# Patient Record
Sex: Male | Born: 1945
Health system: Southern US, Community
[De-identification: ages and names within clinical notes are randomized; demographics above are authoritative.]

## PROBLEM LIST (undated history)

## (undated) DIAGNOSIS — I10 Essential (primary) hypertension: Secondary | ICD-10-CM

## (undated) DIAGNOSIS — M5126 Other intervertebral disc displacement, lumbar region: Secondary | ICD-10-CM

## (undated) DIAGNOSIS — N189 Chronic kidney disease, unspecified: Secondary | ICD-10-CM

## (undated) DIAGNOSIS — E785 Hyperlipidemia, unspecified: Secondary | ICD-10-CM

## (undated) DIAGNOSIS — M51369 Other intervertebral disc degeneration, lumbar region without mention of lumbar back pain or lower extremity pain: Secondary | ICD-10-CM

## (undated) HISTORY — PX: NECK SURGERY: SHX720

## (undated) HISTORY — DX: Hyperlipidemia, unspecified: E78.5

## (undated) HISTORY — DX: Essential (primary) hypertension: I10

## (undated) HISTORY — PX: CHOLECYSTECTOMY: SHX55

## (undated) HISTORY — PX: BACK SURGERY: SHX140

## (undated) HISTORY — DX: Other intervertebral disc displacement, lumbar region: M51.26

## (undated) HISTORY — DX: Chronic kidney disease, unspecified: N18.9

## (undated) HISTORY — DX: Other intervertebral disc degeneration, lumbar region without mention of lumbar back pain or lower extremity pain: M51.369

---

## 1998-11-10 ENCOUNTER — Inpatient Hospital Stay (HOSPITAL_COMMUNITY): Admission: EM | Admit: 1998-11-10 | Discharge: 1998-11-11 | Payer: Self-pay | Admitting: Emergency Medicine

## 1998-11-10 ENCOUNTER — Encounter: Payer: Self-pay | Admitting: Neurosurgery

## 1999-01-21 ENCOUNTER — Encounter: Payer: Self-pay | Admitting: Neurosurgery

## 1999-01-21 ENCOUNTER — Ambulatory Visit (HOSPITAL_COMMUNITY): Admission: RE | Admit: 1999-01-21 | Discharge: 1999-01-21 | Payer: Self-pay | Admitting: Neurosurgery

## 2001-04-08 ENCOUNTER — Emergency Department (HOSPITAL_COMMUNITY): Admission: EM | Admit: 2001-04-08 | Discharge: 2001-04-08 | Payer: Self-pay | Admitting: Emergency Medicine

## 2001-04-08 ENCOUNTER — Encounter: Payer: Self-pay | Admitting: Emergency Medicine

## 2002-06-14 ENCOUNTER — Ambulatory Visit (HOSPITAL_COMMUNITY): Admission: RE | Admit: 2002-06-14 | Discharge: 2002-06-14 | Payer: Self-pay | Admitting: Orthopaedic Surgery

## 2002-06-14 ENCOUNTER — Encounter: Payer: Self-pay | Admitting: Orthopaedic Surgery

## 2002-08-08 ENCOUNTER — Emergency Department (HOSPITAL_COMMUNITY): Admission: EM | Admit: 2002-08-08 | Discharge: 2002-08-08 | Payer: Self-pay | Admitting: Emergency Medicine

## 2002-08-08 ENCOUNTER — Encounter: Payer: Self-pay | Admitting: Emergency Medicine

## 2004-04-15 ENCOUNTER — Emergency Department (HOSPITAL_COMMUNITY): Admission: EM | Admit: 2004-04-15 | Discharge: 2004-04-15 | Payer: Self-pay | Admitting: Emergency Medicine

## 2004-04-19 ENCOUNTER — Emergency Department (HOSPITAL_COMMUNITY): Admission: EM | Admit: 2004-04-19 | Discharge: 2004-04-20 | Payer: Self-pay | Admitting: *Deleted

## 2004-07-30 ENCOUNTER — Ambulatory Visit (HOSPITAL_COMMUNITY): Admission: RE | Admit: 2004-07-30 | Discharge: 2004-07-30 | Payer: Self-pay | Admitting: Allergy

## 2005-08-18 ENCOUNTER — Emergency Department (HOSPITAL_COMMUNITY): Admission: EM | Admit: 2005-08-18 | Discharge: 2005-08-18 | Payer: Self-pay | Admitting: Emergency Medicine

## 2008-02-06 ENCOUNTER — Ambulatory Visit (HOSPITAL_COMMUNITY): Admission: RE | Admit: 2008-02-06 | Discharge: 2008-02-06 | Payer: Self-pay | Admitting: Preventative Medicine

## 2008-12-01 ENCOUNTER — Emergency Department (HOSPITAL_COMMUNITY): Admission: EM | Admit: 2008-12-01 | Discharge: 2008-12-01 | Payer: Self-pay | Admitting: Emergency Medicine

## 2009-07-23 ENCOUNTER — Ambulatory Visit (HOSPITAL_COMMUNITY): Admission: RE | Admit: 2009-07-23 | Discharge: 2009-07-23 | Payer: Self-pay | Admitting: Orthopaedic Surgery

## 2011-08-21 IMAGING — CR DG CHEST 2V
2 series · 2 of 2 positions shown · non-contrast
Comparison: None.

CLINICAL DATA: Cough.
 CHEST TWO VIEWS:

[view not recorded (1 of 2)]
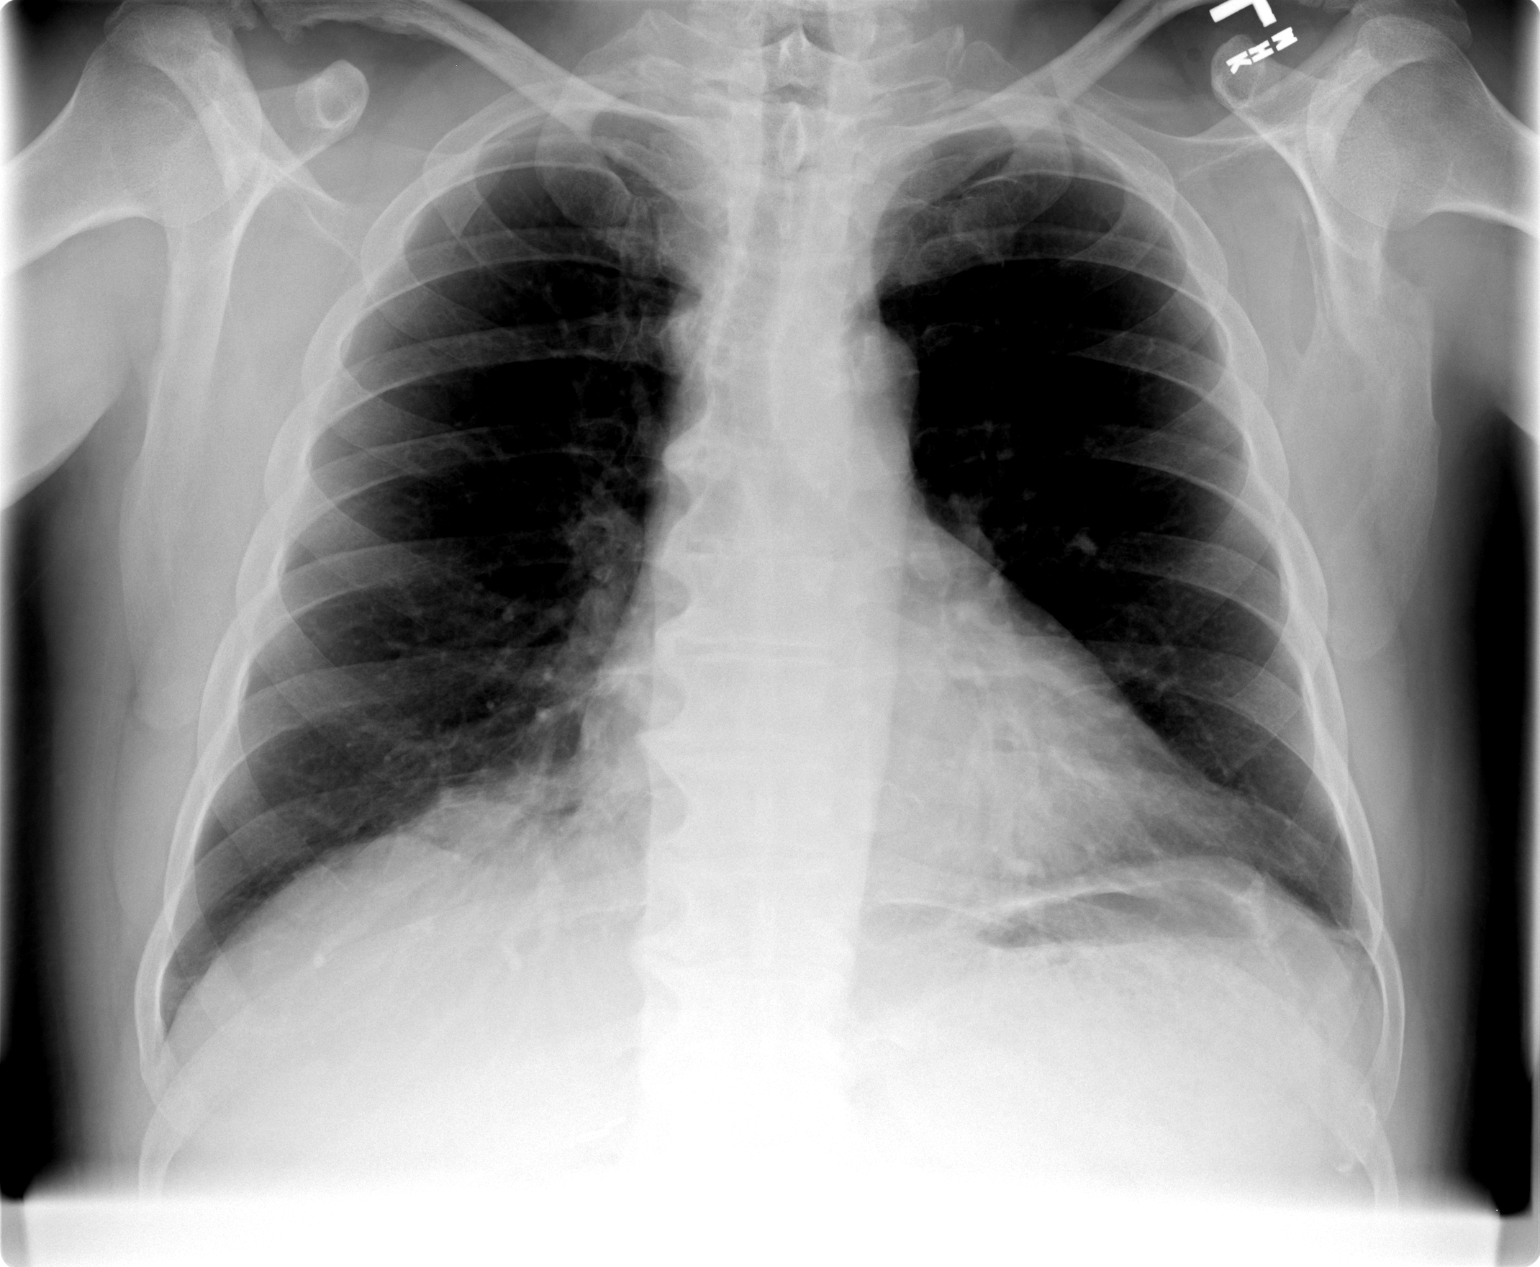

[view not recorded (2 of 2)]
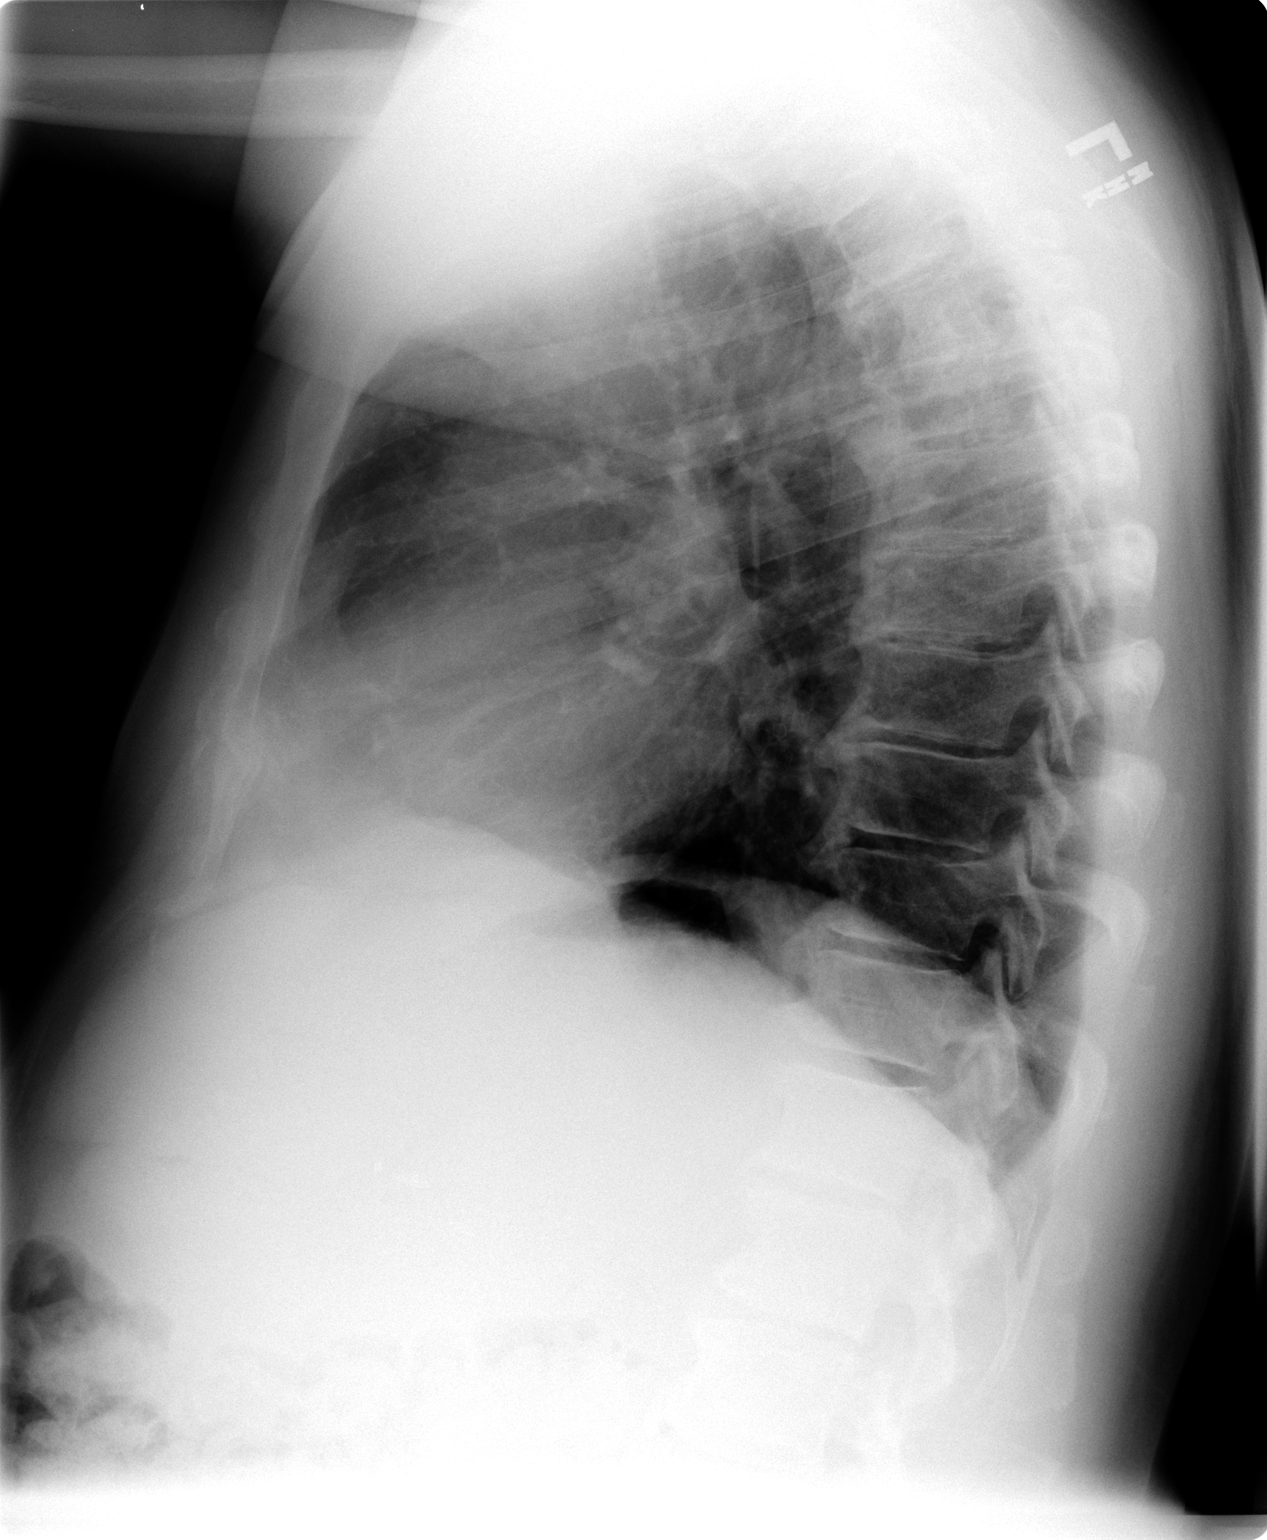

[2 of 2 positions shown; findings below may reference images not displayed]

Heart size upper limits of normal with cardiothoracic ratio approaching 50%.  Lungs clear.  Osseous structures intact with prominent osteophytes of the spine.
IMPRESSION: No active cardiopulmonary disease.

## 2012-03-28 DIAGNOSIS — Z23 Encounter for immunization: Secondary | ICD-10-CM | POA: Diagnosis not present

## 2013-04-02 DIAGNOSIS — Z23 Encounter for immunization: Secondary | ICD-10-CM | POA: Diagnosis not present

## 2013-07-30 DIAGNOSIS — Z0000 Encounter for general adult medical examination without abnormal findings: Secondary | ICD-10-CM | POA: Diagnosis not present

## 2013-07-30 DIAGNOSIS — I10 Essential (primary) hypertension: Secondary | ICD-10-CM | POA: Diagnosis not present

## 2013-08-20 DIAGNOSIS — R7309 Other abnormal glucose: Secondary | ICD-10-CM | POA: Diagnosis not present

## 2013-08-20 DIAGNOSIS — I10 Essential (primary) hypertension: Secondary | ICD-10-CM | POA: Diagnosis not present

## 2014-01-20 DIAGNOSIS — I10 Essential (primary) hypertension: Secondary | ICD-10-CM | POA: Diagnosis not present

## 2014-01-23 DIAGNOSIS — R5381 Other malaise: Secondary | ICD-10-CM | POA: Diagnosis not present

## 2014-01-23 DIAGNOSIS — R5383 Other fatigue: Secondary | ICD-10-CM | POA: Diagnosis not present

## 2014-01-23 DIAGNOSIS — N529 Male erectile dysfunction, unspecified: Secondary | ICD-10-CM | POA: Diagnosis not present

## 2014-01-23 DIAGNOSIS — I10 Essential (primary) hypertension: Secondary | ICD-10-CM | POA: Diagnosis not present

## 2014-03-14 DIAGNOSIS — Z23 Encounter for immunization: Secondary | ICD-10-CM | POA: Diagnosis not present

## 2014-05-05 DIAGNOSIS — I10 Essential (primary) hypertension: Secondary | ICD-10-CM | POA: Diagnosis not present

## 2014-05-05 DIAGNOSIS — J309 Allergic rhinitis, unspecified: Secondary | ICD-10-CM | POA: Diagnosis not present

## 2014-09-01 DIAGNOSIS — I10 Essential (primary) hypertension: Secondary | ICD-10-CM | POA: Diagnosis not present

## 2014-09-03 DIAGNOSIS — E782 Mixed hyperlipidemia: Secondary | ICD-10-CM | POA: Diagnosis not present

## 2014-09-03 DIAGNOSIS — I10 Essential (primary) hypertension: Secondary | ICD-10-CM | POA: Diagnosis not present

## 2014-09-03 DIAGNOSIS — Z6822 Body mass index (BMI) 22.0-22.9, adult: Secondary | ICD-10-CM | POA: Diagnosis not present

## 2015-02-10 DIAGNOSIS — E782 Mixed hyperlipidemia: Secondary | ICD-10-CM | POA: Diagnosis not present

## 2015-02-10 DIAGNOSIS — I10 Essential (primary) hypertension: Secondary | ICD-10-CM | POA: Diagnosis not present

## 2015-02-13 DIAGNOSIS — E782 Mixed hyperlipidemia: Secondary | ICD-10-CM | POA: Diagnosis not present

## 2015-02-13 DIAGNOSIS — J309 Allergic rhinitis, unspecified: Secondary | ICD-10-CM | POA: Diagnosis not present

## 2015-02-13 DIAGNOSIS — R944 Abnormal results of kidney function studies: Secondary | ICD-10-CM | POA: Diagnosis not present

## 2015-02-13 DIAGNOSIS — R070 Pain in throat: Secondary | ICD-10-CM | POA: Diagnosis not present

## 2015-02-13 DIAGNOSIS — I10 Essential (primary) hypertension: Secondary | ICD-10-CM | POA: Diagnosis not present

## 2015-03-25 DIAGNOSIS — Z23 Encounter for immunization: Secondary | ICD-10-CM | POA: Diagnosis not present

## 2015-08-17 DIAGNOSIS — E782 Mixed hyperlipidemia: Secondary | ICD-10-CM | POA: Diagnosis not present

## 2015-08-17 DIAGNOSIS — I10 Essential (primary) hypertension: Secondary | ICD-10-CM | POA: Diagnosis not present

## 2015-08-17 DIAGNOSIS — R7301 Impaired fasting glucose: Secondary | ICD-10-CM | POA: Diagnosis not present

## 2015-08-19 DIAGNOSIS — I10 Essential (primary) hypertension: Secondary | ICD-10-CM | POA: Diagnosis not present

## 2015-08-19 DIAGNOSIS — E782 Mixed hyperlipidemia: Secondary | ICD-10-CM | POA: Diagnosis not present

## 2015-08-19 DIAGNOSIS — R944 Abnormal results of kidney function studies: Secondary | ICD-10-CM | POA: Diagnosis not present

## 2016-02-22 DIAGNOSIS — R7301 Impaired fasting glucose: Secondary | ICD-10-CM | POA: Diagnosis not present

## 2016-02-22 DIAGNOSIS — E782 Mixed hyperlipidemia: Secondary | ICD-10-CM | POA: Diagnosis not present

## 2016-02-24 DIAGNOSIS — E782 Mixed hyperlipidemia: Secondary | ICD-10-CM | POA: Diagnosis not present

## 2016-02-24 DIAGNOSIS — I10 Essential (primary) hypertension: Secondary | ICD-10-CM | POA: Diagnosis not present

## 2016-02-24 DIAGNOSIS — D649 Anemia, unspecified: Secondary | ICD-10-CM | POA: Diagnosis not present

## 2016-03-22 DIAGNOSIS — Z23 Encounter for immunization: Secondary | ICD-10-CM | POA: Diagnosis not present

## 2016-08-26 DIAGNOSIS — Z125 Encounter for screening for malignant neoplasm of prostate: Secondary | ICD-10-CM | POA: Diagnosis not present

## 2016-08-26 DIAGNOSIS — I10 Essential (primary) hypertension: Secondary | ICD-10-CM | POA: Diagnosis not present

## 2016-08-26 DIAGNOSIS — D649 Anemia, unspecified: Secondary | ICD-10-CM | POA: Diagnosis not present

## 2016-08-26 DIAGNOSIS — R7301 Impaired fasting glucose: Secondary | ICD-10-CM | POA: Diagnosis not present

## 2016-08-26 DIAGNOSIS — E782 Mixed hyperlipidemia: Secondary | ICD-10-CM | POA: Diagnosis not present

## 2016-08-31 DIAGNOSIS — Z0000 Encounter for general adult medical examination without abnormal findings: Secondary | ICD-10-CM | POA: Diagnosis not present

## 2016-08-31 DIAGNOSIS — N182 Chronic kidney disease, stage 2 (mild): Secondary | ICD-10-CM | POA: Diagnosis not present

## 2016-08-31 DIAGNOSIS — I10 Essential (primary) hypertension: Secondary | ICD-10-CM | POA: Diagnosis not present

## 2016-08-31 DIAGNOSIS — D649 Anemia, unspecified: Secondary | ICD-10-CM | POA: Diagnosis not present

## 2016-08-31 DIAGNOSIS — E782 Mixed hyperlipidemia: Secondary | ICD-10-CM | POA: Diagnosis not present

## 2017-04-10 DIAGNOSIS — Z23 Encounter for immunization: Secondary | ICD-10-CM | POA: Diagnosis not present

## 2017-08-30 DIAGNOSIS — I10 Essential (primary) hypertension: Secondary | ICD-10-CM | POA: Diagnosis not present

## 2017-08-30 DIAGNOSIS — R7301 Impaired fasting glucose: Secondary | ICD-10-CM | POA: Diagnosis not present

## 2017-08-30 DIAGNOSIS — E782 Mixed hyperlipidemia: Secondary | ICD-10-CM | POA: Diagnosis not present

## 2017-08-30 DIAGNOSIS — D649 Anemia, unspecified: Secondary | ICD-10-CM | POA: Diagnosis not present

## 2017-09-01 DIAGNOSIS — D649 Anemia, unspecified: Secondary | ICD-10-CM | POA: Diagnosis not present

## 2017-09-01 DIAGNOSIS — R7301 Impaired fasting glucose: Secondary | ICD-10-CM | POA: Diagnosis not present

## 2017-09-01 DIAGNOSIS — I10 Essential (primary) hypertension: Secondary | ICD-10-CM | POA: Diagnosis not present

## 2017-09-01 DIAGNOSIS — N182 Chronic kidney disease, stage 2 (mild): Secondary | ICD-10-CM | POA: Diagnosis not present

## 2017-09-01 DIAGNOSIS — E782 Mixed hyperlipidemia: Secondary | ICD-10-CM | POA: Diagnosis not present

## 2017-09-01 DIAGNOSIS — Z6823 Body mass index (BMI) 23.0-23.9, adult: Secondary | ICD-10-CM | POA: Diagnosis not present

## 2018-01-15 DIAGNOSIS — N182 Chronic kidney disease, stage 2 (mild): Secondary | ICD-10-CM | POA: Diagnosis not present

## 2018-01-15 DIAGNOSIS — E782 Mixed hyperlipidemia: Secondary | ICD-10-CM | POA: Diagnosis not present

## 2018-01-15 DIAGNOSIS — Z79899 Other long term (current) drug therapy: Secondary | ICD-10-CM | POA: Diagnosis not present

## 2018-01-15 DIAGNOSIS — R5383 Other fatigue: Secondary | ICD-10-CM | POA: Diagnosis not present

## 2018-01-15 DIAGNOSIS — I10 Essential (primary) hypertension: Secondary | ICD-10-CM | POA: Diagnosis not present

## 2018-01-15 DIAGNOSIS — R7301 Impaired fasting glucose: Secondary | ICD-10-CM | POA: Diagnosis not present

## 2018-01-15 DIAGNOSIS — D649 Anemia, unspecified: Secondary | ICD-10-CM | POA: Diagnosis not present

## 2018-01-18 DIAGNOSIS — R7301 Impaired fasting glucose: Secondary | ICD-10-CM | POA: Diagnosis not present

## 2018-01-18 DIAGNOSIS — N182 Chronic kidney disease, stage 2 (mild): Secondary | ICD-10-CM | POA: Diagnosis not present

## 2018-01-18 DIAGNOSIS — D649 Anemia, unspecified: Secondary | ICD-10-CM | POA: Diagnosis not present

## 2018-01-18 DIAGNOSIS — Z6821 Body mass index (BMI) 21.0-21.9, adult: Secondary | ICD-10-CM | POA: Diagnosis not present

## 2018-01-18 DIAGNOSIS — E782 Mixed hyperlipidemia: Secondary | ICD-10-CM | POA: Diagnosis not present

## 2018-01-18 DIAGNOSIS — I10 Essential (primary) hypertension: Secondary | ICD-10-CM | POA: Diagnosis not present

## 2018-03-14 DIAGNOSIS — Z23 Encounter for immunization: Secondary | ICD-10-CM | POA: Diagnosis not present

## 2018-06-12 DIAGNOSIS — H0230 Blepharochalasis unspecified eye, unspecified eyelid: Secondary | ICD-10-CM | POA: Diagnosis not present

## 2018-06-12 DIAGNOSIS — H52223 Regular astigmatism, bilateral: Secondary | ICD-10-CM | POA: Diagnosis not present

## 2018-06-12 DIAGNOSIS — H5213 Myopia, bilateral: Secondary | ICD-10-CM | POA: Diagnosis not present

## 2018-06-12 DIAGNOSIS — H2513 Age-related nuclear cataract, bilateral: Secondary | ICD-10-CM | POA: Diagnosis not present

## 2018-07-24 DIAGNOSIS — E782 Mixed hyperlipidemia: Secondary | ICD-10-CM | POA: Diagnosis not present

## 2018-07-24 DIAGNOSIS — I10 Essential (primary) hypertension: Secondary | ICD-10-CM | POA: Diagnosis not present

## 2018-07-24 DIAGNOSIS — R7301 Impaired fasting glucose: Secondary | ICD-10-CM | POA: Diagnosis not present

## 2018-07-24 DIAGNOSIS — D649 Anemia, unspecified: Secondary | ICD-10-CM | POA: Diagnosis not present

## 2018-07-31 DIAGNOSIS — I10 Essential (primary) hypertension: Secondary | ICD-10-CM | POA: Diagnosis not present

## 2018-07-31 DIAGNOSIS — E782 Mixed hyperlipidemia: Secondary | ICD-10-CM | POA: Diagnosis not present

## 2018-07-31 DIAGNOSIS — N182 Chronic kidney disease, stage 2 (mild): Secondary | ICD-10-CM | POA: Diagnosis not present

## 2018-07-31 DIAGNOSIS — R7301 Impaired fasting glucose: Secondary | ICD-10-CM | POA: Diagnosis not present

## 2018-07-31 DIAGNOSIS — K4090 Unilateral inguinal hernia, without obstruction or gangrene, not specified as recurrent: Secondary | ICD-10-CM | POA: Diagnosis not present

## 2018-07-31 DIAGNOSIS — D649 Anemia, unspecified: Secondary | ICD-10-CM | POA: Diagnosis not present

## 2018-07-31 DIAGNOSIS — D696 Thrombocytopenia, unspecified: Secondary | ICD-10-CM | POA: Diagnosis not present

## 2018-11-22 DIAGNOSIS — Z0000 Encounter for general adult medical examination without abnormal findings: Secondary | ICD-10-CM | POA: Diagnosis not present

## 2019-02-04 DIAGNOSIS — E782 Mixed hyperlipidemia: Secondary | ICD-10-CM | POA: Diagnosis not present

## 2019-02-04 DIAGNOSIS — Z125 Encounter for screening for malignant neoplasm of prostate: Secondary | ICD-10-CM | POA: Diagnosis not present

## 2019-02-04 DIAGNOSIS — I10 Essential (primary) hypertension: Secondary | ICD-10-CM | POA: Diagnosis not present

## 2019-02-04 DIAGNOSIS — N182 Chronic kidney disease, stage 2 (mild): Secondary | ICD-10-CM | POA: Diagnosis not present

## 2019-02-04 DIAGNOSIS — D649 Anemia, unspecified: Secondary | ICD-10-CM | POA: Diagnosis not present

## 2019-02-04 DIAGNOSIS — R7301 Impaired fasting glucose: Secondary | ICD-10-CM | POA: Diagnosis not present

## 2019-02-07 DIAGNOSIS — Z23 Encounter for immunization: Secondary | ICD-10-CM | POA: Diagnosis not present

## 2019-02-07 DIAGNOSIS — R7301 Impaired fasting glucose: Secondary | ICD-10-CM | POA: Diagnosis not present

## 2019-02-07 DIAGNOSIS — E782 Mixed hyperlipidemia: Secondary | ICD-10-CM | POA: Diagnosis not present

## 2019-02-07 DIAGNOSIS — K4090 Unilateral inguinal hernia, without obstruction or gangrene, not specified as recurrent: Secondary | ICD-10-CM | POA: Diagnosis not present

## 2019-02-07 DIAGNOSIS — D696 Thrombocytopenia, unspecified: Secondary | ICD-10-CM | POA: Diagnosis not present

## 2019-02-07 DIAGNOSIS — I10 Essential (primary) hypertension: Secondary | ICD-10-CM | POA: Diagnosis not present

## 2019-02-07 DIAGNOSIS — D649 Anemia, unspecified: Secondary | ICD-10-CM | POA: Diagnosis not present

## 2019-02-07 DIAGNOSIS — R7303 Prediabetes: Secondary | ICD-10-CM | POA: Diagnosis not present

## 2019-02-07 DIAGNOSIS — N182 Chronic kidney disease, stage 2 (mild): Secondary | ICD-10-CM | POA: Diagnosis not present

## 2019-02-08 ENCOUNTER — Other Ambulatory Visit: Payer: Self-pay

## 2019-03-19 DIAGNOSIS — Z23 Encounter for immunization: Secondary | ICD-10-CM | POA: Diagnosis not present

## 2019-08-05 DIAGNOSIS — I10 Essential (primary) hypertension: Secondary | ICD-10-CM | POA: Diagnosis not present

## 2019-08-05 DIAGNOSIS — Z23 Encounter for immunization: Secondary | ICD-10-CM | POA: Diagnosis not present

## 2019-08-05 DIAGNOSIS — R7301 Impaired fasting glucose: Secondary | ICD-10-CM | POA: Diagnosis not present

## 2019-08-05 DIAGNOSIS — R7303 Prediabetes: Secondary | ICD-10-CM | POA: Diagnosis not present

## 2019-08-05 DIAGNOSIS — N182 Chronic kidney disease, stage 2 (mild): Secondary | ICD-10-CM | POA: Diagnosis not present

## 2019-08-05 DIAGNOSIS — Z6821 Body mass index (BMI) 21.0-21.9, adult: Secondary | ICD-10-CM | POA: Diagnosis not present

## 2019-08-05 DIAGNOSIS — E782 Mixed hyperlipidemia: Secondary | ICD-10-CM | POA: Diagnosis not present

## 2019-08-05 DIAGNOSIS — D649 Anemia, unspecified: Secondary | ICD-10-CM | POA: Diagnosis not present

## 2019-08-05 DIAGNOSIS — K4090 Unilateral inguinal hernia, without obstruction or gangrene, not specified as recurrent: Secondary | ICD-10-CM | POA: Diagnosis not present

## 2019-08-05 DIAGNOSIS — D696 Thrombocytopenia, unspecified: Secondary | ICD-10-CM | POA: Diagnosis not present

## 2019-08-05 DIAGNOSIS — Z6823 Body mass index (BMI) 23.0-23.9, adult: Secondary | ICD-10-CM | POA: Diagnosis not present

## 2019-08-08 DIAGNOSIS — D649 Anemia, unspecified: Secondary | ICD-10-CM | POA: Diagnosis not present

## 2019-08-08 DIAGNOSIS — D696 Thrombocytopenia, unspecified: Secondary | ICD-10-CM | POA: Diagnosis not present

## 2019-08-08 DIAGNOSIS — R7301 Impaired fasting glucose: Secondary | ICD-10-CM | POA: Diagnosis not present

## 2019-08-08 DIAGNOSIS — N182 Chronic kidney disease, stage 2 (mild): Secondary | ICD-10-CM | POA: Diagnosis not present

## 2019-08-08 DIAGNOSIS — K4090 Unilateral inguinal hernia, without obstruction or gangrene, not specified as recurrent: Secondary | ICD-10-CM | POA: Diagnosis not present

## 2019-08-08 DIAGNOSIS — E782 Mixed hyperlipidemia: Secondary | ICD-10-CM | POA: Diagnosis not present

## 2019-08-08 DIAGNOSIS — R7303 Prediabetes: Secondary | ICD-10-CM | POA: Diagnosis not present

## 2019-08-08 DIAGNOSIS — I10 Essential (primary) hypertension: Secondary | ICD-10-CM | POA: Diagnosis not present

## 2019-10-18 DIAGNOSIS — Z23 Encounter for immunization: Secondary | ICD-10-CM | POA: Diagnosis not present

## 2019-11-15 DIAGNOSIS — Z23 Encounter for immunization: Secondary | ICD-10-CM | POA: Diagnosis not present

## 2020-02-05 DIAGNOSIS — I10 Essential (primary) hypertension: Secondary | ICD-10-CM | POA: Diagnosis not present

## 2020-02-05 DIAGNOSIS — N182 Chronic kidney disease, stage 2 (mild): Secondary | ICD-10-CM | POA: Diagnosis not present

## 2020-02-05 DIAGNOSIS — D649 Anemia, unspecified: Secondary | ICD-10-CM | POA: Diagnosis not present

## 2020-02-05 DIAGNOSIS — R7301 Impaired fasting glucose: Secondary | ICD-10-CM | POA: Diagnosis not present

## 2020-02-05 DIAGNOSIS — R7303 Prediabetes: Secondary | ICD-10-CM | POA: Diagnosis not present

## 2020-02-05 DIAGNOSIS — E782 Mixed hyperlipidemia: Secondary | ICD-10-CM | POA: Diagnosis not present

## 2020-02-10 DIAGNOSIS — Z0001 Encounter for general adult medical examination with abnormal findings: Secondary | ICD-10-CM | POA: Diagnosis not present

## 2020-02-10 DIAGNOSIS — D696 Thrombocytopenia, unspecified: Secondary | ICD-10-CM | POA: Diagnosis not present

## 2020-02-10 DIAGNOSIS — E782 Mixed hyperlipidemia: Secondary | ICD-10-CM | POA: Diagnosis not present

## 2020-02-10 DIAGNOSIS — R7303 Prediabetes: Secondary | ICD-10-CM | POA: Diagnosis not present

## 2020-02-10 DIAGNOSIS — D649 Anemia, unspecified: Secondary | ICD-10-CM | POA: Diagnosis not present

## 2020-02-10 DIAGNOSIS — N182 Chronic kidney disease, stage 2 (mild): Secondary | ICD-10-CM | POA: Diagnosis not present

## 2020-02-10 DIAGNOSIS — I10 Essential (primary) hypertension: Secondary | ICD-10-CM | POA: Diagnosis not present

## 2020-02-10 DIAGNOSIS — R7301 Impaired fasting glucose: Secondary | ICD-10-CM | POA: Diagnosis not present

## 2020-02-10 DIAGNOSIS — K4090 Unilateral inguinal hernia, without obstruction or gangrene, not specified as recurrent: Secondary | ICD-10-CM | POA: Diagnosis not present

## 2020-02-17 DIAGNOSIS — Z1211 Encounter for screening for malignant neoplasm of colon: Secondary | ICD-10-CM | POA: Diagnosis not present

## 2020-03-12 DIAGNOSIS — Z23 Encounter for immunization: Secondary | ICD-10-CM | POA: Diagnosis not present

## 2020-03-25 ENCOUNTER — Encounter: Payer: Self-pay | Admitting: Internal Medicine

## 2020-05-13 ENCOUNTER — Ambulatory Visit (INDEPENDENT_AMBULATORY_CARE_PROVIDER_SITE_OTHER): Payer: Medicare Other | Admitting: Gastroenterology

## 2020-05-13 ENCOUNTER — Encounter: Payer: Self-pay | Admitting: *Deleted

## 2020-05-13 ENCOUNTER — Encounter: Payer: Self-pay | Admitting: Gastroenterology

## 2020-05-13 ENCOUNTER — Other Ambulatory Visit: Payer: Self-pay

## 2020-05-13 DIAGNOSIS — I10 Essential (primary) hypertension: Secondary | ICD-10-CM | POA: Diagnosis not present

## 2020-05-13 DIAGNOSIS — R195 Other fecal abnormalities: Secondary | ICD-10-CM | POA: Diagnosis not present

## 2020-05-13 NOTE — Patient Instructions (Addendum)
1. Your blood pressure remains significantly elevated today in our office. I would recommend you call your PCP today and let him know of our readings. You should also have your blood pressure cuff checked in their office or ours to make sure it is accurate.   2. Colonoscopy as scheduled. See separate instructions.

## 2020-05-13 NOTE — H&P (View-Only) (Signed)
Primary Care Physician:  Celene Squibb, MD  Primary Gastroenterologist:  Elon Alas. Abbey Chatters, DO   Chief Complaint  Patient presents with  . +cologuard    tcs approx 30 yrs ago; not having problems    HPI:  Dennis Gilbert is a 74 y.o. male here at the request of Dr. Nevada Crane for further evaluation of positive Cologuard.    Patient's last colonoscopy was over 30 years ago.  He elected to undergo Cologuard in the setting of pandemic.  Completed this in August and was positive.  Patient states his bowel movements are regular.  No melena or rectal bleeding.  Denies abdominal pain.  Appetite good.  No upper GI symptoms.  Very active, walks 3-1/2 miles per day.  Has had back and neck surgery.  Currently dealing with a bulging disc in the lumbar spine.  Uses Ultram on occasion about once every other week.  Otherwise feels good.  Blood pressure today.  He states is typically up when he is in the doctor's office but at home runs in the 120/70 range.  When he was seen back in August by PCP his blood pressure was 150/90.  Significantly elevated on multiple checks today in the office.  See below.  Labs from July 2021: White blood cell count 3500, hemoglobin 13.8, hematocrit 42.4, MCV 101, platelets 154,000, BUN 15, creatinine 1.27, albumin 4.4, total bilirubin 0.5, alk phos 68, AST 30, ALT 14, A1c 5.7, iron 116, TIBC 283, iron saturations 41%, ferritin 161.  Current Outpatient Medications  Medication Sig Dispense Refill  . losartan (COZAAR) 100 MG tablet Take 100 mg by mouth daily.    . traMADol (ULTRAM) 50 MG tablet Take 50 mg by mouth as needed.     No current facility-administered medications for this visit.    Allergies as of 05/13/2020  . (No Known Allergies)    Past Medical History:  Diagnosis Date  . Bulging lumbar disc   . Chronic kidney disease (CKD)   . HTN (hypertension)   . Hyperlipidemia     Past Surgical History:  Procedure Laterality Date  . BACK SURGERY    . CHOLECYSTECTOMY     . NECK SURGERY      Family History  Problem Relation Age of Onset  . Lung cancer Mother 4  . Heart attack Father 64  . Colon cancer Neg Hx   . Colon polyps Neg Hx     Social History   Socioeconomic History  . Marital status: Legally Separated    Spouse name: Not on file  . Number of children: Not on file  . Years of education: Not on file  . Highest education level: Not on file  Occupational History  . Not on file  Tobacco Use  . Smoking status: Never Smoker  . Smokeless tobacco: Never Used  Substance and Sexual Activity  . Alcohol use: Not Currently    Comment: no history of regular use  . Drug use: Not Currently  . Sexual activity: Not on file  Other Topics Concern  . Not on file  Social History Narrative  . Not on file   Social Determinants of Health   Financial Resource Strain:   . Difficulty of Paying Living Expenses: Not on file  Food Insecurity:   . Worried About Charity fundraiser in the Last Year: Not on file  . Ran Out of Food in the Last Year: Not on file  Transportation Needs:   . Lack of Transportation (Medical):  Not on file  . Lack of Transportation (Non-Medical): Not on file  Physical Activity:   . Days of Exercise per Week: Not on file  . Minutes of Exercise per Session: Not on file  Stress:   . Feeling of Stress : Not on file  Social Connections:   . Frequency of Communication with Friends and Family: Not on file  . Frequency of Social Gatherings with Friends and Family: Not on file  . Attends Religious Services: Not on file  . Active Member of Clubs or Organizations: Not on file  . Attends Archivist Meetings: Not on file  . Marital Status: Not on file  Intimate Partner Violence:   . Fear of Current or Ex-Partner: Not on file  . Emotionally Abused: Not on file  . Physically Abused: Not on file  . Sexually Abused: Not on file      ROS:  General: Negative for anorexia, weight loss, fever, chills, fatigue, weakness. Eyes:  Negative for vision changes.  ENT: Negative for hoarseness, difficulty swallowing , nasal congestion. CV: Negative for chest pain, angina, palpitations, dyspnea on exertion, peripheral edema.  Respiratory: Negative for dyspnea at rest, dyspnea on exertion, cough, sputum, wheezing.  GI: See history of present illness. GU:  Negative for dysuria, hematuria, urinary incontinence, urinary frequency, nocturnal urination.  MS: Negative for joint pain. intermittent low back pain.  Derm: Negative for rash or itching.  Neuro: Negative for weakness, abnormal sensation, seizure, frequent headaches, memory loss, confusion.  Psych: Negative for anxiety, depression, suicidal ideation, hallucinations.  Endo: Negative for unusual weight change.  Heme: Negative for bruising or bleeding. Allergy: Negative for rash or hives.    Physical Examination:  BP (!) 198/84   Pulse (!) 58   Temp (!) 96.8 F (36 C) (Temporal)   Ht 5' 6"  (1.676 m)   Wt 134 lb (60.8 kg)   BMI 21.63 kg/m    General: Well-nourished, well-developed in no acute distress.  Head: Normocephalic, atraumatic.   Eyes: Conjunctiva pink, no icterus. Mouth:masked Neck: Supple without thyromegaly, masses, or lymphadenopathy.  Lungs: Clear to auscultation bilaterally.  Heart: Regular rate and rhythm, no murmurs rubs or gallops.  Abdomen: Bowel sounds are normal, nontender, nondistended, no hepatosplenomegaly or masses, no abdominal bruits or    hernia , no rebound or guarding.   Rectal: Not performed Extremities: No lower extremity edema. No clubbing or deformities.  Neuro: Alert and oriented x 4 , grossly normal neurologically.  Skin: Warm and dry, no rash or jaundice.   Psych: Alert and cooperative, normal mood and affect.  Labs: See above  Imaging Studies: No results found.  Impression/plan:  Pleasant 74 year old male presenting for further evaluation of positive Cologuard.  His last colonoscopy was over 30 years ago.  Discussed  with patient at length today, positive Cologuard result could mean either blood detected in the stool and/or DNA markers linked with adenomatous colon polyps or colon cancer were found. Recommend colonoscopy in the near future with Dr. Abbey Chatters. ASA II.  I have discussed the risks, alternatives, benefits with regards to but not limited to the risk of reaction to medication, bleeding, infection, perforation and the patient is agreeable to proceed. Written consent to be obtained.  Hypertension: Elevated blood pressures in the office today.  Was significantly elevated and typically when seen by PCP.  He reports this morning his blood pressure was 120/70 at home.  Advised him that he should consider having his blood pressure cuff checked and compared either  in our office or his PCPs office. Today however I feel he should touch base with PCP regarding his elevated pressures today.

## 2020-05-13 NOTE — Progress Notes (Signed)
Primary Care Physician:  Celene Squibb, MD  Primary Gastroenterologist:  Elon Alas. Abbey Chatters, DO   Chief Complaint  Patient presents with  . +cologuard    tcs approx 30 yrs ago; not having problems    HPI:  Dennis Gilbert is a 74 y.o. male here at the request of Dr. Nevada Crane for further evaluation of positive Cologuard.    Patient's last colonoscopy was over 30 years ago.  He elected to undergo Cologuard in the setting of pandemic.  Completed this in August and was positive.  Patient states his bowel movements are regular.  No melena or rectal bleeding.  Denies abdominal pain.  Appetite good.  No upper GI symptoms.  Very active, walks 3-1/2 miles per day.  Has had back and neck surgery.  Currently dealing with a bulging disc in the lumbar spine.  Uses Ultram on occasion about once every other week.  Otherwise feels good.  Blood pressure today.  He states is typically up when he is in the doctor's office but at home runs in the 120/70 range.  When he was seen back in August by PCP his blood pressure was 150/90.  Significantly elevated on multiple checks today in the office.  See below.  Labs from July 2021: White blood cell count 3500, hemoglobin 13.8, hematocrit 42.4, MCV 101, platelets 154,000, BUN 15, creatinine 1.27, albumin 4.4, total bilirubin 0.5, alk phos 68, AST 30, ALT 14, A1c 5.7, iron 116, TIBC 283, iron saturations 41%, ferritin 161.  Current Outpatient Medications  Medication Sig Dispense Refill  . losartan (COZAAR) 100 MG tablet Take 100 mg by mouth daily.    . traMADol (ULTRAM) 50 MG tablet Take 50 mg by mouth as needed.     No current facility-administered medications for this visit.    Allergies as of 05/13/2020  . (No Known Allergies)    Past Medical History:  Diagnosis Date  . Bulging lumbar disc   . Chronic kidney disease (CKD)   . HTN (hypertension)   . Hyperlipidemia     Past Surgical History:  Procedure Laterality Date  . BACK SURGERY    . CHOLECYSTECTOMY     . NECK SURGERY      Family History  Problem Relation Age of Onset  . Lung cancer Mother 66  . Heart attack Father 59  . Colon cancer Neg Hx   . Colon polyps Neg Hx     Social History   Socioeconomic History  . Marital status: Legally Separated    Spouse name: Not on file  . Number of children: Not on file  . Years of education: Not on file  . Highest education level: Not on file  Occupational History  . Not on file  Tobacco Use  . Smoking status: Never Smoker  . Smokeless tobacco: Never Used  Substance and Sexual Activity  . Alcohol use: Not Currently    Comment: no history of regular use  . Drug use: Not Currently  . Sexual activity: Not on file  Other Topics Concern  . Not on file  Social History Narrative  . Not on file   Social Determinants of Health   Financial Resource Strain:   . Difficulty of Paying Living Expenses: Not on file  Food Insecurity:   . Worried About Charity fundraiser in the Last Year: Not on file  . Ran Out of Food in the Last Year: Not on file  Transportation Needs:   . Lack of Transportation (Medical):  Not on file  . Lack of Transportation (Non-Medical): Not on file  Physical Activity:   . Days of Exercise per Week: Not on file  . Minutes of Exercise per Session: Not on file  Stress:   . Feeling of Stress : Not on file  Social Connections:   . Frequency of Communication with Friends and Family: Not on file  . Frequency of Social Gatherings with Friends and Family: Not on file  . Attends Religious Services: Not on file  . Active Member of Clubs or Organizations: Not on file  . Attends Archivist Meetings: Not on file  . Marital Status: Not on file  Intimate Partner Violence:   . Fear of Current or Ex-Partner: Not on file  . Emotionally Abused: Not on file  . Physically Abused: Not on file  . Sexually Abused: Not on file      ROS:  General: Negative for anorexia, weight loss, fever, chills, fatigue, weakness. Eyes:  Negative for vision changes.  ENT: Negative for hoarseness, difficulty swallowing , nasal congestion. CV: Negative for chest pain, angina, palpitations, dyspnea on exertion, peripheral edema.  Respiratory: Negative for dyspnea at rest, dyspnea on exertion, cough, sputum, wheezing.  GI: See history of present illness. GU:  Negative for dysuria, hematuria, urinary incontinence, urinary frequency, nocturnal urination.  MS: Negative for joint pain. intermittent low back pain.  Derm: Negative for rash or itching.  Neuro: Negative for weakness, abnormal sensation, seizure, frequent headaches, memory loss, confusion.  Psych: Negative for anxiety, depression, suicidal ideation, hallucinations.  Endo: Negative for unusual weight change.  Heme: Negative for bruising or bleeding. Allergy: Negative for rash or hives.    Physical Examination:  BP (!) 198/84   Pulse (!) 58   Temp (!) 96.8 F (36 C) (Temporal)   Ht 5' 6"  (1.676 m)   Wt 134 lb (60.8 kg)   BMI 21.63 kg/m    General: Well-nourished, well-developed in no acute distress.  Head: Normocephalic, atraumatic.   Eyes: Conjunctiva pink, no icterus. Mouth:masked Neck: Supple without thyromegaly, masses, or lymphadenopathy.  Lungs: Clear to auscultation bilaterally.  Heart: Regular rate and rhythm, no murmurs rubs or gallops.  Abdomen: Bowel sounds are normal, nontender, nondistended, no hepatosplenomegaly or masses, no abdominal bruits or    hernia , no rebound or guarding.   Rectal: Not performed Extremities: No lower extremity edema. No clubbing or deformities.  Neuro: Alert and oriented x 4 , grossly normal neurologically.  Skin: Warm and dry, no rash or jaundice.   Psych: Alert and cooperative, normal mood and affect.  Labs: See above  Imaging Studies: No results found.  Impression/plan:  Pleasant 74 year old male presenting for further evaluation of positive Cologuard.  His last colonoscopy was over 30 years ago.  Discussed  with patient at length today, positive Cologuard result could mean either blood detected in the stool and/or DNA markers linked with adenomatous colon polyps or colon cancer were found. Recommend colonoscopy in the near future with Dr. Abbey Chatters. ASA II.  I have discussed the risks, alternatives, benefits with regards to but not limited to the risk of reaction to medication, bleeding, infection, perforation and the patient is agreeable to proceed. Written consent to be obtained.  Hypertension: Elevated blood pressures in the office today.  Was significantly elevated and typically when seen by PCP.  He reports this morning his blood pressure was 120/70 at home.  Advised him that he should consider having his blood pressure cuff checked and compared either  in our office or his PCPs office. Today however I feel he should touch base with PCP regarding his elevated pressures today.

## 2020-05-15 ENCOUNTER — Other Ambulatory Visit (HOSPITAL_COMMUNITY)
Admission: RE | Admit: 2020-05-15 | Discharge: 2020-05-15 | Disposition: A | Discharge status: Home / Self Care | Payer: Medicare Other | Source: Ambulatory Visit | Attending: Internal Medicine | Admitting: Internal Medicine

## 2020-05-15 ENCOUNTER — Other Ambulatory Visit: Payer: Self-pay

## 2020-05-15 DIAGNOSIS — Z01812 Encounter for preprocedural laboratory examination: Secondary | ICD-10-CM | POA: Insufficient documentation

## 2020-05-15 DIAGNOSIS — Z20822 Contact with and (suspected) exposure to covid-19: Secondary | ICD-10-CM | POA: Diagnosis not present

## 2020-05-16 LAB — SARS CORONAVIRUS 2 (TAT 6-24 HRS): SARS Coronavirus 2: NEGATIVE

## 2020-05-18 ENCOUNTER — Ambulatory Visit (HOSPITAL_COMMUNITY)
Admission: RE | Admit: 2020-05-18 | Discharge: 2020-05-18 | Disposition: A | Discharge status: Home / Self Care | Payer: Medicare Other | Attending: Internal Medicine | Admitting: Internal Medicine

## 2020-05-18 ENCOUNTER — Ambulatory Visit (HOSPITAL_COMMUNITY): Payer: Medicare Other | Admitting: Anesthesiology

## 2020-05-18 ENCOUNTER — Encounter (HOSPITAL_COMMUNITY)
Admission: RE | Disposition: A | Discharge status: Home / Self Care | Payer: Self-pay | Source: Home / Self Care | Attending: Internal Medicine

## 2020-05-18 ENCOUNTER — Other Ambulatory Visit: Payer: Self-pay

## 2020-05-18 ENCOUNTER — Encounter (HOSPITAL_COMMUNITY): Payer: Self-pay

## 2020-05-18 DIAGNOSIS — Z79899 Other long term (current) drug therapy: Secondary | ICD-10-CM | POA: Insufficient documentation

## 2020-05-18 DIAGNOSIS — I129 Hypertensive chronic kidney disease with stage 1 through stage 4 chronic kidney disease, or unspecified chronic kidney disease: Secondary | ICD-10-CM | POA: Insufficient documentation

## 2020-05-18 DIAGNOSIS — K5730 Diverticulosis of large intestine without perforation or abscess without bleeding: Secondary | ICD-10-CM | POA: Insufficient documentation

## 2020-05-18 DIAGNOSIS — D123 Benign neoplasm of transverse colon: Secondary | ICD-10-CM | POA: Diagnosis not present

## 2020-05-18 DIAGNOSIS — R195 Other fecal abnormalities: Secondary | ICD-10-CM | POA: Diagnosis not present

## 2020-05-18 DIAGNOSIS — N189 Chronic kidney disease, unspecified: Secondary | ICD-10-CM | POA: Diagnosis not present

## 2020-05-18 DIAGNOSIS — K648 Other hemorrhoids: Secondary | ICD-10-CM | POA: Diagnosis not present

## 2020-05-18 DIAGNOSIS — K635 Polyp of colon: Secondary | ICD-10-CM

## 2020-05-18 HISTORY — PX: POLYPECTOMY: SHX149

## 2020-05-18 HISTORY — PX: COLONOSCOPY WITH PROPOFOL: SHX5780

## 2020-05-18 SURGERY — COLONOSCOPY WITH PROPOFOL
Anesthesia: General

## 2020-05-18 MED ORDER — LACTATED RINGERS IV SOLN: INTRAVENOUS | Status: DC | PRN

## 2020-05-18 MED ORDER — LACTATED RINGERS IV SOLN: Freq: Once | INTRAVENOUS | Status: AC

## 2020-05-18 MED ORDER — PROPOFOL 500 MG/50ML IV EMUL
INTRAVENOUS | Status: DC | PRN
  Administered 2020-05-18: 150 ug/kg/min via INTRAVENOUS

## 2020-05-18 MED ORDER — PROPOFOL 10 MG/ML IV BOLUS
INTRAVENOUS | Status: DC | PRN
  Administered 2020-05-18: 20 mg via INTRAVENOUS
  Administered 2020-05-18 (×3): 40 mg via INTRAVENOUS
  Administered 2020-05-18: 50 mg via INTRAVENOUS

## 2020-05-18 MED ORDER — STERILE WATER FOR IRRIGATION IR SOLN
Status: DC | PRN
  Administered 2020-05-18: 100 mL

## 2020-05-18 NOTE — Addendum Note (Signed)
Addendum  created 05/18/20 1102 by Ollen Bowl, CRNA   Charge Capture section accepted

## 2020-05-18 NOTE — Transfer of Care (Signed)
Immediate Anesthesia Transfer of Care Note  Patient: Dennis Gilbert  Procedure(s) Performed: COLONOSCOPY WITH PROPOFOL (N/A ) POLYPECTOMY INTESTINAL  Patient Location: Endoscopy Unit  Anesthesia Type:General  Level of Consciousness: awake  Airway & Oxygen Therapy: Patient Spontanous Breathing  Post-op Assessment: Report given to RN  Post vital signs: Reviewed  Last Vitals:  Vitals Value Taken Time  BP    Temp    Pulse    Resp    SpO2      Last Pain:  Vitals:   05/18/20 0913  TempSrc:   PainSc: 0-No pain      Patients Stated Pain Goal: 3 (15/86/82 5749)  Complications: No complications documented.

## 2020-05-18 NOTE — Anesthesia Preprocedure Evaluation (Addendum)
Anesthesia Evaluation  Patient identified by MRN, date of birth, ID band Patient awake    Reviewed: Allergy & Precautions, NPO status , Patient's Chart, lab work & pertinent test results  History of Anesthesia Complications Negative for: history of anesthetic complications  Airway Mallampati: II  TM Distance: >3 FB Neck ROM: Full    Dental  (+) Dental Advisory Given, Upper Dentures, Lower Dentures   Pulmonary neg pulmonary ROS,    Pulmonary exam normal        Cardiovascular hypertension, Pt. on medications Normal cardiovascular exam Rhythm:Regular Rate:Normal     Neuro/Psych negative neurological ROS     GI/Hepatic negative GI ROS, Neg liver ROS,   Endo/Other    Renal/GU Renal disease  negative genitourinary   Musculoskeletal  (+) Arthritis  (back pain, lumbar disc herniation),   Abdominal   Peds negative pediatric ROS (+)  Hematology negative hematology ROS (+)   Anesthesia Other Findings   Reproductive/Obstetrics negative OB ROS                            Anesthesia Physical Anesthesia Plan  ASA: II  Anesthesia Plan: General   Post-op Pain Management:    Induction: Intravenous  PONV Risk Score and Plan: TIVA  Airway Management Planned: Nasal Cannula and Natural Airway  Additional Equipment:   Intra-op Plan:   Post-operative Plan:   Informed Consent: I have reviewed the patients History and Physical, chart, labs and discussed the procedure including the risks, benefits and alternatives for the proposed anesthesia with the patient or authorized representative who has indicated his/her understanding and acceptance.       Plan Discussed with: CRNA and Surgeon  Anesthesia Plan Comments:        Anesthesia Quick Evaluation

## 2020-05-18 NOTE — Anesthesia Postprocedure Evaluation (Signed)
Anesthesia Post Note  Patient: Dennis Gilbert  Procedure(s) Performed: COLONOSCOPY WITH PROPOFOL (N/A ) POLYPECTOMY INTESTINAL  Patient location during evaluation: Endoscopy Anesthesia Type: General Level of consciousness: awake and alert Pain management: pain level controlled Vital Signs Assessment: post-procedure vital signs reviewed and stable Respiratory status: spontaneous breathing Cardiovascular status: blood pressure returned to baseline and stable Postop Assessment: no apparent nausea or vomiting Anesthetic complications: no   No complications documented.   Last Vitals:  Vitals:   05/18/20 0748  BP: (!) 167/70  Resp: 14  Temp: 37.2 C  SpO2: 98%    Last Pain:  Vitals:   05/18/20 0913  TempSrc:   PainSc: 0-No pain                 Yaremi Stahlman

## 2020-05-18 NOTE — Interval H&P Note (Signed)
History and Physical Interval Note:  05/18/2020 8:29 AM  Dennis Gilbert  has presented today for surgery, with the diagnosis of positive cologuard.  The various methods of treatment have been discussed with the patient and family. After consideration of risks, benefits and other options for treatment, the patient has consented to  Procedure(s) with comments: COLONOSCOPY WITH PROPOFOL (N/A) - 11:45am as a surgical intervention.  The patient's history has been reviewed, patient examined, no change in status, stable for surgery.  I have reviewed the patient's chart and labs.  Questions were answered to the patient's satisfaction.     Eloise Harman

## 2020-05-18 NOTE — Op Note (Signed)
[facility] Patient Name: Dennis Gilbert Procedure Date: 05/18/2020 9:09 AM MRN: [id number] Date of Birth: Jul 27, 1945 Attending MD: Elon Alas. Abbey Chatters DO CSN: [id number] Age: 74 Admit Type: Outpatient Procedure:                Colonoscopy Indications:              Positive Cologuard test Providers:                Elon Alas. Abbey Chatters, DO, Crystal Page, Randa Spike, Technician Referring MD:              Medicines:                See the Anesthesia note for documentation of the                            administered medications Complications:            No immediate complications. Estimated Blood Loss:     Estimated blood loss was minimal. Procedure:                Pre-Anesthesia Assessment:                           - The anesthesia plan was to use monitored                            anesthesia care (MAC).                           After obtaining informed consent, the colonoscope                            was passed under direct vision. Throughout the                            procedure, the patient's blood pressure, pulse, and                            oxygen saturations were monitored continuously. The                            [id number]-HQ190L ([id number]) scope was introduced through                            the anus and advanced to the the cecum, identified                            by appendiceal orifice and ileocecal valve. The                            colonoscopy was performed without difficulty. The                            patient tolerated the procedure well. The quality  of the bowel preparation was evaluated using the                            BBPS [facility] Bowel Preparation Scale) with scores                            of: Right Colon = 2 (minor amount of residual                            staining, small fragments of stool and/or opaque                            liquid, but mucosa seen well), Transverse  Colon = 2                            (minor amount of residual staining, small fragments                            of stool and/or opaque liquid, but mucosa seen                            well) and Left Colon = 2 (minor amount of residual                            staining, small fragments of stool and/or opaque                            liquid, but mucosa seen well). The total BBPS score                            equals 6. The quality of the bowel preparation was                            fair. Scope In: 9:20:14 AM Scope Out: 9:34:54 AM Scope Withdrawal Time: 0 hours 9 minutes 10 seconds  Total Procedure Duration: 0 hours 14 minutes 40 seconds  Findings:      The perianal and digital rectal examinations were normal.      Non-bleeding internal hemorrhoids were found during endoscopy.      Multiple small-mouthed diverticula were found in the sigmoid colon and       descending colon.      A 5 mm polyp was found in the transverse colon. The polyp was flat. The       polyp was removed with a cold snare. Resection and retrieval were       complete. Impression:               - Preparation of the colon was fair.                           - Non-bleeding internal hemorrhoids.                           - Diverticulosis in the sigmoid colon and in the  descending colon.                           - One 5 mm polyp in the transverse colon, removed                            with a cold snare. Resected and retrieved. Moderate Sedation:      Per Anesthesia Care Recommendation:           - Patient has a contact number available for                            emergencies. The signs and symptoms of potential                            delayed complications were discussed with the                            patient. Return to normal activities tomorrow.                            Written discharge instructions were provided to the                            patient.                            - Resume previous diet.                           - Continue present medications.                           - Await pathology results.                           - Repeat colonoscopy in 5 years for surveillance.                           - Return to GI clinic PRN. Procedure Code(s):        --- Professional ---                           [phone], Colonoscopy, flexible; with removal of                            tumor(s), polyp(s), or other lesion(s) by snare                            technique Diagnosis Code(s):        --- Professional ---                           K64.8, Other hemorrhoids                           K63.5, Polyp of colon  R19.5, Other fecal abnormalities                           K57.30, Diverticulosis of large intestine without                            perforation or abscess without bleeding CPT copyright 2019 [employer]. All rights reserved. The codes documented in this report are preliminary and upon coder review may  be revised to meet current compliance requirements. Elon Alas. Abbey Chatters, DO [place] Abbey Chatters, DO 05/18/2020 9:37:25 AM This report has been signed electronically. Number of Addenda: 0

## 2020-05-18 NOTE — Discharge Instructions (Addendum)
Colonoscopy Discharge Instructions  Read the instructions outlined below and refer to this sheet in the next few weeks. These discharge instructions provide you with general information on caring for yourself after you leave the hospital. Your doctor may also give you specific instructions. While your treatment has been planned according to the most current medical practices available, unavoidable complications occasionally occur.   ACTIVITY  You may resume your regular activity, but move at a slower pace for the next 24 hours.   Take frequent rest periods for the next 24 hours.   Walking will help get rid of the air and reduce the bloated feeling in your belly (abdomen).   No driving for 24 hours (because of the medicine (anesthesia) used during the test).    Do not sign any important legal documents or operate any machinery for 24 hours (because of the anesthesia used during the test).  NUTRITION  Drink plenty of fluids.   You may resume your normal diet as instructed by your doctor.   Begin with a light meal and progress to your normal diet. Heavy or fried foods are harder to digest and may make you feel sick to your stomach (nauseated).   Avoid alcoholic beverages for 24 hours or as instructed.  MEDICATIONS  You may resume your normal medications unless your doctor tells you otherwise.  WHAT YOU CAN EXPECT TODAY  Some feelings of bloating in the abdomen.   Passage of more gas than usual.   Spotting of blood in your stool or on the toilet paper.  IF YOU HAD POLYPS REMOVED DURING THE COLONOSCOPY:  No aspirin products for 7 days or as instructed.   No alcohol for 7 days or as instructed.   Eat a soft diet for the next 24 hours.  FINDING OUT THE RESULTS OF YOUR TEST Not all test results are available during your visit. If your test results are not back during the visit, make an appointment with your caregiver to find out the results. Do not assume everything is normal if  you have not heard from your caregiver or the medical facility. It is important for you to follow up on all of your test results.  SEEK IMMEDIATE MEDICAL ATTENTION IF:  You have more than a spotting of blood in your stool.   Your belly is swollen (abdominal distention).   You are nauseated or vomiting.   You have a temperature over 101.   You have abdominal pain or discomfort that is severe or gets worse throughout the day.   Your colonoscopy revealed 1 polyp(s) which I removed successfully. Await pathology results, my office will contact you. I recommend repeating colonoscopy in 5 years for surveillance purposes.   You also have diverticulosis and internal hemorrhoids. I would recommend increasing fiber in your diet or adding OTC Benefiber/Metamucil. Be sure to drink at least 4 to 6 glasses of water daily. Follow-up with GI as needed.   I hope you have a great rest of your week!  Elon Alas. Abbey Chatters, D.O. Gastroenterology and Hepatology [facility]   Hemorrhoids Hemorrhoids are swollen veins that may develop:  In the butt (rectum). These are called internal hemorrhoids.  Around the opening of the butt (anus). These are called external hemorrhoids. Hemorrhoids can cause pain, itching, or bleeding. Most of the time, they do not cause serious problems. They usually get better with diet changes, lifestyle changes, and other home treatments. What are the causes? This condition may be caused by:  Having trouble pooping (constipation).  Pushing hard (straining) to poop.  Watery poop (diarrhea).  Pregnancy.  Being very overweight (obese).  Sitting for long periods of time.  Heavy lifting or other activity that causes you to strain.  Anal sex.  Riding a bike for a long period of time. What are the signs or symptoms? Symptoms of this condition include:  Pain.  Itching or soreness in the butt.  Bleeding from the butt.  Leaking  poop.  Swelling in the area.  One or more lumps around the opening of your butt. How is this diagnosed? A doctor can often diagnose this condition by looking at the affected area. The doctor may also:  Do an exam that involves feeling the area with a gloved hand (digital rectal exam).  Examine the area inside your butt using a small tube (anoscope).  Order blood tests. This may be done if you have lost a lot of blood.  Have you get a test that involves looking inside the colon using a flexible tube with a camera on the end (sigmoidoscopy or colonoscopy). How is this treated? This condition can usually be treated at home. Your doctor may tell you to change what you eat, make lifestyle changes, or try home treatments. If these do not help, procedures can be done to remove the hemorrhoids or make them smaller. These may involve:  Placing rubber bands at the base of the hemorrhoids to cut off their blood supply.  Injecting medicine into the hemorrhoids to shrink them.  Shining a type of light energy onto the hemorrhoids to cause them to fall off.  Doing surgery to remove the hemorrhoids or cut off their blood supply. Follow these instructions at home: Eating and drinking   Eat foods that have a lot of fiber in them. These include whole grains, beans, nuts, fruits, and vegetables.  Ask your doctor about taking products that have added fiber (fibersupplements).  Reduce the amount of fat in your diet. You can do this by: ? Eating low-fat dairy products. ? Eating less red meat. ? Avoiding processed foods.  Drink enough fluid to keep your pee (urine) pale yellow. Managing pain and swelling   Take a warm-water bath (sitz bath) for 20 minutes to ease pain. Do this 3-4 times a day. You may do this in a bathtub or using a portable sitz bath that fits over the toilet.  If told, put ice on the painful area. It may be helpful to use ice between your warm baths. ? Put ice in a plastic  bag. ? Place a towel between your skin and the bag. ? Leave the ice on for 20 minutes, 2-3 times a day. General instructions  Take over-the-counter and prescription medicines only as told by your doctor. ? Medicated creams and medicines may be used as told.  Exercise often. Ask your doctor how much and what kind of exercise is best for you.  Go to the bathroom when you have the urge to poop. Do not wait.  Avoid pushing too hard when you poop.  Keep your butt dry and clean. Use wet toilet paper or moist towelettes after pooping.  Do not sit on the toilet for a long time.  Keep all follow-up visits as told by your doctor. This is important. Contact a doctor if you:  Have pain and swelling that do not get better with treatment or medicine.  Have trouble pooping.  Cannot poop.  Have pain or swelling outside the area  of the hemorrhoids. Get help right away if you have:  Bleeding that will not stop. Summary  Hemorrhoids are swollen veins in the butt or around the opening of the butt.  They can cause pain, itching, or bleeding.  Eat foods that have a lot of fiber in them. These include whole grains, beans, nuts, fruits, and vegetables.  Take a warm-water bath (sitz bath) for 20 minutes to ease pain. Do this 3-4 times a day. This information is not intended to replace advice given to you by your health care provider. Make sure you discuss any questions you have with your health care provider. Document Revised: 07/05/2018 Document Reviewed: 11/16/2017 Elsevier Patient Education  [place].  Diverticulosis  Diverticulosis is a condition that develops when small pouches (diverticula) form in the wall of the large intestine (colon). The colon is where water is absorbed and stool (feces) is formed. The pouches form when the inside layer of the colon pushes through weak spots in the outer layers of the colon. You may have a few pouches or many of them. The pouches usually do  not cause problems unless they become inflamed or infected. When this happens, the condition is called diverticulitis. What are the causes? The cause of this condition is not known. What increases the risk? The following factors may make you more likely to develop this condition:  Being older than age 77. Your risk for this condition increases with age. Diverticulosis is rare among people younger than age 44. By age 10, many people have it.  Eating a low-fiber diet.  Having frequent constipation.  Being overweight.  Not getting enough exercise.  Smoking.  Taking over-the-counter pain medicines, like aspirin and ibuprofen.  Having a family history of diverticulosis. What are the signs or symptoms? In most people, there are no symptoms of this condition. If you do have symptoms, they may include:  Bloating.  Cramps in the abdomen.  Constipation or diarrhea.  Pain in the lower left side of the abdomen. How is this diagnosed? Because diverticulosis usually has no symptoms, it is most often diagnosed during an exam for other colon problems. The condition may be diagnosed by:  Using a flexible scope to examine the colon (colonoscopy).  Taking an X-ray of the colon after dye has been put into the colon (barium enema).  Having a CT scan. How is this treated? You may not need treatment for this condition. Your health care provider may recommend treatment to prevent problems. You may need treatment if you have symptoms or if you previously had diverticulitis. Treatment may include:  Eating a high-fiber diet.  Taking a fiber supplement.  Taking a live bacteria supplement (probiotic).  Taking medicine to relax your colon. Follow these instructions at home: Medicines  Take over-the-counter and prescription medicines only as told by your health care provider.  If told by your health care provider, take a fiber supplement or probiotic. Constipation prevention Your condition  may cause constipation. To prevent or treat constipation, you may need to:  Drink enough fluid to keep your urine pale yellow.  Take over-the-counter or prescription medicines.  Eat foods that are high in fiber, such as beans, whole grains, and fresh fruits and vegetables.  Limit foods that are high in fat and processed sugars, such as fried or sweet foods.  General instructions  Try not to strain when you have a bowel movement.  Keep all follow-up visits as told by your health care provider. This is important.  Contact a health care provider if you:  Have pain in your abdomen.  Have bloating.  Have cramps.  Have not had a bowel movement in 3 days. Get help right away if:  Your pain gets worse.  Your bloating becomes very bad.  You have a fever or chills, and your symptoms suddenly get worse.  You vomit.  You have bowel movements that are bloody or black.  You have bleeding from your rectum. Summary  Diverticulosis is a condition that develops when small pouches (diverticula) form in the wall of the large intestine (colon).  You may have a few pouches or many of them.  This condition is most often diagnosed during an exam for other colon problems.  Treatment may include increasing the fiber in your diet, taking supplements, or taking medicines. This information is not intended to replace advice given to you by your health care provider. Make sure you discuss any questions you have with your health care provider. Document Revised: 01/24/2019 Document Reviewed: 01/24/2019 Elsevier Patient Education  [place].  Colon Polyps  Polyps are tissue growths inside the body. Polyps can grow in many places, including the large intestine (colon). A polyp may be a round bump or a mushroom-shaped growth. You could have one polyp or several. Most colon polyps are noncancerous (benign). However, some colon polyps can become cancerous over time. Finding and removing the  polyps early can help prevent this. What are the causes? The exact cause of colon polyps is not known. What increases the risk? You are more likely to develop this condition if you:  Have a family history of colon cancer or colon polyps.  Are older than 102 or older than 45 if you are African American.  Have inflammatory bowel disease, such as ulcerative colitis or Crohn's disease.  Have certain hereditary conditions, such as: ? Familial adenomatous polyposis. ? Lynch syndrome. ? Turcot syndrome. ? Peutz-Jeghers syndrome.  Are overweight.  Smoke cigarettes.  Do not get enough exercise.  Drink too much alcohol.  Eat a diet that is high in fat and red meat and low in fiber.  Had childhood cancer that was treated with abdominal radiation. What are the signs or symptoms? Most polyps do not cause symptoms. If you have symptoms, they may include:  Blood coming from your rectum when having a bowel movement.  Blood in your stool. The stool may look dark red or black.  Abdominal pain.  A change in bowel habits, such as constipation or diarrhea. How is this diagnosed? This condition is diagnosed with a colonoscopy. This is a procedure in which a lighted, flexible scope is inserted into the anus and then passed into the colon to examine the area. Polyps are sometimes found when a colonoscopy is done as part of routine cancer screening tests. How is this treated? Treatment for this condition involves removing any polyps that are found. Most polyps can be removed during a colonoscopy. Those polyps will then be tested for cancer. Additional treatment may be needed depending on the results of testing. Follow these instructions at home: Lifestyle  Maintain a healthy weight, or lose weight if recommended by your health care provider.  Exercise every day or as told by your health care provider.  Do not use any products that contain nicotine or tobacco, such as cigarettes and  e-cigarettes. If you need help quitting, ask your health care provider.  If you drink alcohol, limit how much you have: ? 0-1 drink a day  for women. ? 0-2 drinks a day for men.  Be aware of how much alcohol is in your drink. In the [place] one drink equals one 12 oz bottle of beer (355 mL), one 5 oz glass of wine (148 mL), or one 1 oz shot of hard liquor (44 mL). Eating and drinking   Eat foods that are high in fiber, such as fruits, vegetables, and whole grains.  Eat foods that are high in calcium and vitamin D, such as milk, cheese, yogurt, eggs, liver, fish, and broccoli.  Limit foods that are high in fat, such as fried foods and desserts.  Limit the amount of red meat and processed meat you eat, such as hot dogs, sausage, bacon, and lunch meats. General instructions  Keep all follow-up visits as told by your health care provider. This is important. ? This includes having regularly scheduled colonoscopies. ? Talk to your health care provider about when you need a colonoscopy. Contact a health care provider if:  You have new or worsening bleeding during a bowel movement.  You have new or increased blood in your stool.  You have a change in bowel habits.  You lose weight for no known reason. Summary  Polyps are tissue growths inside the body. Polyps can grow in many places, including the colon.  Most colon polyps are noncancerous (benign), but some can become cancerous over time.  This condition is diagnosed with a colonoscopy.  Treatment for this condition involves removing any polyps that are found. Most polyps can be removed during a colonoscopy. This information is not intended to replace advice given to you by your health care provider. Make sure you discuss any questions you have with your health care provider. Document Revised: 10/12/2017 Document Reviewed: 10/12/2017 Elsevier Patient Education  [place].

## 2020-05-19 LAB — SURGICAL PATHOLOGY

## 2020-05-22 ENCOUNTER — Encounter (HOSPITAL_COMMUNITY): Payer: Self-pay | Admitting: Internal Medicine

## 2020-05-26 ENCOUNTER — Telehealth: Payer: Self-pay | Admitting: Internal Medicine

## 2020-05-26 NOTE — Telephone Encounter (Signed)
SEE RESULT NOTE 

## 2020-05-26 NOTE — Telephone Encounter (Signed)
Pt returning call. [phone]

## 2020-05-27 DIAGNOSIS — I10 Essential (primary) hypertension: Secondary | ICD-10-CM | POA: Diagnosis not present

## 2020-06-19 DIAGNOSIS — Z23 Encounter for immunization: Secondary | ICD-10-CM | POA: Diagnosis not present

## 2020-08-11 DIAGNOSIS — Z6821 Body mass index (BMI) 21.0-21.9, adult: Secondary | ICD-10-CM | POA: Diagnosis not present

## 2020-08-11 DIAGNOSIS — K4090 Unilateral inguinal hernia, without obstruction or gangrene, not specified as recurrent: Secondary | ICD-10-CM | POA: Diagnosis not present

## 2020-08-11 DIAGNOSIS — R7303 Prediabetes: Secondary | ICD-10-CM | POA: Diagnosis not present

## 2020-08-11 DIAGNOSIS — Z0001 Encounter for general adult medical examination with abnormal findings: Secondary | ICD-10-CM | POA: Diagnosis not present

## 2020-08-11 DIAGNOSIS — D649 Anemia, unspecified: Secondary | ICD-10-CM | POA: Diagnosis not present

## 2020-08-11 DIAGNOSIS — Z6823 Body mass index (BMI) 23.0-23.9, adult: Secondary | ICD-10-CM | POA: Diagnosis not present

## 2020-08-11 DIAGNOSIS — N182 Chronic kidney disease, stage 2 (mild): Secondary | ICD-10-CM | POA: Diagnosis not present

## 2020-08-11 DIAGNOSIS — Z125 Encounter for screening for malignant neoplasm of prostate: Secondary | ICD-10-CM | POA: Diagnosis not present

## 2020-08-11 DIAGNOSIS — I10 Essential (primary) hypertension: Secondary | ICD-10-CM | POA: Diagnosis not present

## 2020-08-11 DIAGNOSIS — E782 Mixed hyperlipidemia: Secondary | ICD-10-CM | POA: Diagnosis not present

## 2020-08-11 DIAGNOSIS — R7301 Impaired fasting glucose: Secondary | ICD-10-CM | POA: Diagnosis not present

## 2020-08-11 DIAGNOSIS — D696 Thrombocytopenia, unspecified: Secondary | ICD-10-CM | POA: Diagnosis not present

## 2020-08-17 DIAGNOSIS — D696 Thrombocytopenia, unspecified: Secondary | ICD-10-CM | POA: Diagnosis not present

## 2020-08-17 DIAGNOSIS — Z23 Encounter for immunization: Secondary | ICD-10-CM | POA: Diagnosis not present

## 2020-08-17 DIAGNOSIS — I10 Essential (primary) hypertension: Secondary | ICD-10-CM | POA: Diagnosis not present

## 2020-08-17 DIAGNOSIS — R7303 Prediabetes: Secondary | ICD-10-CM | POA: Diagnosis not present

## 2020-08-17 DIAGNOSIS — K4090 Unilateral inguinal hernia, without obstruction or gangrene, not specified as recurrent: Secondary | ICD-10-CM | POA: Diagnosis not present

## 2020-08-17 DIAGNOSIS — N182 Chronic kidney disease, stage 2 (mild): Secondary | ICD-10-CM | POA: Diagnosis not present

## 2020-08-17 DIAGNOSIS — E782 Mixed hyperlipidemia: Secondary | ICD-10-CM | POA: Diagnosis not present

## 2020-08-17 DIAGNOSIS — D649 Anemia, unspecified: Secondary | ICD-10-CM | POA: Diagnosis not present

## 2020-08-17 DIAGNOSIS — R7301 Impaired fasting glucose: Secondary | ICD-10-CM | POA: Diagnosis not present

## 2021-02-10 DIAGNOSIS — R7301 Impaired fasting glucose: Secondary | ICD-10-CM | POA: Diagnosis not present

## 2021-02-10 DIAGNOSIS — E782 Mixed hyperlipidemia: Secondary | ICD-10-CM | POA: Diagnosis not present

## 2021-02-15 DIAGNOSIS — Z0001 Encounter for general adult medical examination with abnormal findings: Secondary | ICD-10-CM | POA: Diagnosis not present

## 2021-02-15 DIAGNOSIS — E782 Mixed hyperlipidemia: Secondary | ICD-10-CM | POA: Diagnosis not present

## 2021-02-15 DIAGNOSIS — D509 Iron deficiency anemia, unspecified: Secondary | ICD-10-CM | POA: Diagnosis not present

## 2021-02-15 DIAGNOSIS — G4700 Insomnia, unspecified: Secondary | ICD-10-CM | POA: Diagnosis not present

## 2021-02-15 DIAGNOSIS — Z86010 Personal history of colonic polyps: Secondary | ICD-10-CM | POA: Diagnosis not present

## 2021-02-15 DIAGNOSIS — R7301 Impaired fasting glucose: Secondary | ICD-10-CM | POA: Diagnosis not present

## 2021-02-15 DIAGNOSIS — N182 Chronic kidney disease, stage 2 (mild): Secondary | ICD-10-CM | POA: Diagnosis not present

## 2021-02-15 DIAGNOSIS — D696 Thrombocytopenia, unspecified: Secondary | ICD-10-CM | POA: Diagnosis not present

## 2021-02-15 DIAGNOSIS — E875 Hyperkalemia: Secondary | ICD-10-CM | POA: Diagnosis not present

## 2021-02-15 DIAGNOSIS — I10 Essential (primary) hypertension: Secondary | ICD-10-CM | POA: Diagnosis not present

## 2021-02-15 DIAGNOSIS — K4090 Unilateral inguinal hernia, without obstruction or gangrene, not specified as recurrent: Secondary | ICD-10-CM | POA: Diagnosis not present

## 2021-04-21 DIAGNOSIS — Z23 Encounter for immunization: Secondary | ICD-10-CM | POA: Diagnosis not present

## 2021-08-13 DIAGNOSIS — D509 Iron deficiency anemia, unspecified: Secondary | ICD-10-CM | POA: Diagnosis not present

## 2021-08-13 DIAGNOSIS — R7301 Impaired fasting glucose: Secondary | ICD-10-CM | POA: Diagnosis not present

## 2021-08-13 DIAGNOSIS — E782 Mixed hyperlipidemia: Secondary | ICD-10-CM | POA: Diagnosis not present

## 2021-08-18 DIAGNOSIS — E782 Mixed hyperlipidemia: Secondary | ICD-10-CM | POA: Diagnosis not present

## 2021-08-18 DIAGNOSIS — D509 Iron deficiency anemia, unspecified: Secondary | ICD-10-CM | POA: Diagnosis not present

## 2021-08-18 DIAGNOSIS — K4090 Unilateral inguinal hernia, without obstruction or gangrene, not specified as recurrent: Secondary | ICD-10-CM | POA: Diagnosis not present

## 2021-08-18 DIAGNOSIS — I10 Essential (primary) hypertension: Secondary | ICD-10-CM | POA: Diagnosis not present

## 2021-08-18 DIAGNOSIS — Z23 Encounter for immunization: Secondary | ICD-10-CM | POA: Diagnosis not present

## 2021-08-18 DIAGNOSIS — D696 Thrombocytopenia, unspecified: Secondary | ICD-10-CM | POA: Diagnosis not present

## 2021-08-18 DIAGNOSIS — R7301 Impaired fasting glucose: Secondary | ICD-10-CM | POA: Diagnosis not present

## 2021-08-18 DIAGNOSIS — Z86010 Personal history of colonic polyps: Secondary | ICD-10-CM | POA: Diagnosis not present

## 2021-08-18 DIAGNOSIS — G4700 Insomnia, unspecified: Secondary | ICD-10-CM | POA: Diagnosis not present

## 2021-08-18 DIAGNOSIS — E875 Hyperkalemia: Secondary | ICD-10-CM | POA: Diagnosis not present

## 2021-08-18 DIAGNOSIS — N182 Chronic kidney disease, stage 2 (mild): Secondary | ICD-10-CM | POA: Diagnosis not present

## 2022-02-10 DIAGNOSIS — D509 Iron deficiency anemia, unspecified: Secondary | ICD-10-CM | POA: Diagnosis not present

## 2022-02-10 DIAGNOSIS — E782 Mixed hyperlipidemia: Secondary | ICD-10-CM | POA: Diagnosis not present

## 2022-02-10 DIAGNOSIS — R7301 Impaired fasting glucose: Secondary | ICD-10-CM | POA: Diagnosis not present

## 2022-02-17 DIAGNOSIS — E875 Hyperkalemia: Secondary | ICD-10-CM | POA: Diagnosis not present

## 2022-02-17 DIAGNOSIS — I10 Essential (primary) hypertension: Secondary | ICD-10-CM | POA: Diagnosis not present

## 2022-02-17 DIAGNOSIS — K4090 Unilateral inguinal hernia, without obstruction or gangrene, not specified as recurrent: Secondary | ICD-10-CM | POA: Diagnosis not present

## 2022-02-17 DIAGNOSIS — R7301 Impaired fasting glucose: Secondary | ICD-10-CM | POA: Diagnosis not present

## 2022-02-17 DIAGNOSIS — G4700 Insomnia, unspecified: Secondary | ICD-10-CM | POA: Diagnosis not present

## 2022-02-17 DIAGNOSIS — E782 Mixed hyperlipidemia: Secondary | ICD-10-CM | POA: Diagnosis not present

## 2022-02-17 DIAGNOSIS — D509 Iron deficiency anemia, unspecified: Secondary | ICD-10-CM | POA: Diagnosis not present

## 2022-02-17 DIAGNOSIS — N182 Chronic kidney disease, stage 2 (mild): Secondary | ICD-10-CM | POA: Diagnosis not present

## 2022-02-17 DIAGNOSIS — M545 Low back pain, unspecified: Secondary | ICD-10-CM | POA: Diagnosis not present

## 2022-02-17 DIAGNOSIS — D696 Thrombocytopenia, unspecified: Secondary | ICD-10-CM | POA: Diagnosis not present

## 2022-02-17 DIAGNOSIS — Z86010 Personal history of colonic polyps: Secondary | ICD-10-CM | POA: Diagnosis not present

## 2022-02-17 DIAGNOSIS — Z0001 Encounter for general adult medical examination with abnormal findings: Secondary | ICD-10-CM | POA: Diagnosis not present

## 2022-04-08 DIAGNOSIS — Z23 Encounter for immunization: Secondary | ICD-10-CM | POA: Diagnosis not present

## 2022-08-16 DIAGNOSIS — E782 Mixed hyperlipidemia: Secondary | ICD-10-CM | POA: Diagnosis not present

## 2022-08-16 DIAGNOSIS — D509 Iron deficiency anemia, unspecified: Secondary | ICD-10-CM | POA: Diagnosis not present

## 2022-08-16 DIAGNOSIS — R7301 Impaired fasting glucose: Secondary | ICD-10-CM | POA: Diagnosis not present

## 2022-08-25 DIAGNOSIS — L03313 Cellulitis of chest wall: Secondary | ICD-10-CM | POA: Diagnosis not present

## 2022-08-25 DIAGNOSIS — I10 Essential (primary) hypertension: Secondary | ICD-10-CM | POA: Diagnosis not present

## 2022-08-25 DIAGNOSIS — G4700 Insomnia, unspecified: Secondary | ICD-10-CM | POA: Diagnosis not present

## 2022-08-25 DIAGNOSIS — I129 Hypertensive chronic kidney disease with stage 1 through stage 4 chronic kidney disease, or unspecified chronic kidney disease: Secondary | ICD-10-CM | POA: Diagnosis not present

## 2022-08-25 DIAGNOSIS — L0390 Cellulitis, unspecified: Secondary | ICD-10-CM | POA: Diagnosis not present

## 2022-08-25 DIAGNOSIS — E875 Hyperkalemia: Secondary | ICD-10-CM | POA: Diagnosis not present

## 2022-08-25 DIAGNOSIS — D696 Thrombocytopenia, unspecified: Secondary | ICD-10-CM | POA: Diagnosis not present

## 2022-08-25 DIAGNOSIS — D509 Iron deficiency anemia, unspecified: Secondary | ICD-10-CM | POA: Diagnosis not present

## 2022-08-25 DIAGNOSIS — N182 Chronic kidney disease, stage 2 (mild): Secondary | ICD-10-CM | POA: Diagnosis not present

## 2022-08-25 DIAGNOSIS — K4090 Unilateral inguinal hernia, without obstruction or gangrene, not specified as recurrent: Secondary | ICD-10-CM | POA: Diagnosis not present

## 2022-08-25 DIAGNOSIS — E782 Mixed hyperlipidemia: Secondary | ICD-10-CM | POA: Diagnosis not present

## 2022-08-25 DIAGNOSIS — N1831 Chronic kidney disease, stage 3a: Secondary | ICD-10-CM | POA: Diagnosis not present

## 2023-02-20 DIAGNOSIS — E782 Mixed hyperlipidemia: Secondary | ICD-10-CM | POA: Diagnosis not present

## 2023-02-20 DIAGNOSIS — R7303 Prediabetes: Secondary | ICD-10-CM | POA: Diagnosis not present

## 2023-02-23 DIAGNOSIS — D696 Thrombocytopenia, unspecified: Secondary | ICD-10-CM | POA: Diagnosis not present

## 2023-02-23 DIAGNOSIS — R7303 Prediabetes: Secondary | ICD-10-CM | POA: Diagnosis not present

## 2023-02-23 DIAGNOSIS — E782 Mixed hyperlipidemia: Secondary | ICD-10-CM | POA: Diagnosis not present

## 2023-02-23 DIAGNOSIS — M545 Low back pain, unspecified: Secondary | ICD-10-CM | POA: Diagnosis not present

## 2023-02-23 DIAGNOSIS — K4090 Unilateral inguinal hernia, without obstruction or gangrene, not specified as recurrent: Secondary | ICD-10-CM | POA: Diagnosis not present

## 2023-02-23 DIAGNOSIS — I10 Essential (primary) hypertension: Secondary | ICD-10-CM | POA: Diagnosis not present

## 2023-02-23 DIAGNOSIS — I129 Hypertensive chronic kidney disease with stage 1 through stage 4 chronic kidney disease, or unspecified chronic kidney disease: Secondary | ICD-10-CM | POA: Diagnosis not present

## 2023-02-23 DIAGNOSIS — N1831 Chronic kidney disease, stage 3a: Secondary | ICD-10-CM | POA: Diagnosis not present

## 2023-02-23 DIAGNOSIS — E875 Hyperkalemia: Secondary | ICD-10-CM | POA: Diagnosis not present

## 2023-02-23 DIAGNOSIS — G4700 Insomnia, unspecified: Secondary | ICD-10-CM | POA: Diagnosis not present

## 2023-02-23 DIAGNOSIS — D72819 Decreased white blood cell count, unspecified: Secondary | ICD-10-CM | POA: Diagnosis not present

## 2023-02-23 DIAGNOSIS — D509 Iron deficiency anemia, unspecified: Secondary | ICD-10-CM | POA: Diagnosis not present

## 2023-04-24 DIAGNOSIS — Z23 Encounter for immunization: Secondary | ICD-10-CM | POA: Diagnosis not present

## 2023-07-02 ENCOUNTER — Other Ambulatory Visit: Payer: Self-pay

## 2023-07-02 ENCOUNTER — Emergency Department (HOSPITAL_COMMUNITY): Payer: Medicare Other

## 2023-07-02 ENCOUNTER — Encounter (HOSPITAL_COMMUNITY): Payer: Self-pay | Admitting: *Deleted

## 2023-07-02 ENCOUNTER — Inpatient Hospital Stay (HOSPITAL_COMMUNITY)
Admission: EM | Admit: 2023-07-02 | Discharge: 2023-07-04 | DRG: 309 | Disposition: A | Discharge status: Home Health | Payer: Medicare Other | Attending: Internal Medicine | Admitting: Internal Medicine

## 2023-07-02 DIAGNOSIS — K4090 Unilateral inguinal hernia, without obstruction or gangrene, not specified as recurrent: Secondary | ICD-10-CM | POA: Diagnosis not present

## 2023-07-02 DIAGNOSIS — K5900 Constipation, unspecified: Secondary | ICD-10-CM | POA: Diagnosis not present

## 2023-07-02 DIAGNOSIS — R42 Dizziness and giddiness: Secondary | ICD-10-CM

## 2023-07-02 DIAGNOSIS — R1084 Generalized abdominal pain: Secondary | ICD-10-CM | POA: Diagnosis not present

## 2023-07-02 DIAGNOSIS — E860 Dehydration: Secondary | ICD-10-CM | POA: Diagnosis not present

## 2023-07-02 DIAGNOSIS — D696 Thrombocytopenia, unspecified: Secondary | ICD-10-CM | POA: Diagnosis present

## 2023-07-02 DIAGNOSIS — K5903 Drug induced constipation: Secondary | ICD-10-CM

## 2023-07-02 DIAGNOSIS — I160 Hypertensive urgency: Principal | ICD-10-CM | POA: Diagnosis present

## 2023-07-02 DIAGNOSIS — E785 Hyperlipidemia, unspecified: Secondary | ICD-10-CM | POA: Diagnosis present

## 2023-07-02 DIAGNOSIS — Z8249 Family history of ischemic heart disease and other diseases of the circulatory system: Secondary | ICD-10-CM | POA: Diagnosis not present

## 2023-07-02 DIAGNOSIS — R001 Bradycardia, unspecified: Secondary | ICD-10-CM | POA: Insufficient documentation

## 2023-07-02 DIAGNOSIS — R1032 Left lower quadrant pain: Secondary | ICD-10-CM | POA: Diagnosis not present

## 2023-07-02 DIAGNOSIS — R110 Nausea: Secondary | ICD-10-CM | POA: Diagnosis not present

## 2023-07-02 DIAGNOSIS — I10 Essential (primary) hypertension: Secondary | ICD-10-CM | POA: Diagnosis not present

## 2023-07-02 DIAGNOSIS — Z79899 Other long term (current) drug therapy: Secondary | ICD-10-CM

## 2023-07-02 DIAGNOSIS — R55 Syncope and collapse: Secondary | ICD-10-CM | POA: Diagnosis not present

## 2023-07-02 DIAGNOSIS — R109 Unspecified abdominal pain: Secondary | ICD-10-CM | POA: Insufficient documentation

## 2023-07-02 DIAGNOSIS — D539 Nutritional anemia, unspecified: Secondary | ICD-10-CM | POA: Diagnosis present

## 2023-07-02 DIAGNOSIS — N1831 Chronic kidney disease, stage 3a: Secondary | ICD-10-CM | POA: Diagnosis present

## 2023-07-02 DIAGNOSIS — Z801 Family history of malignant neoplasm of trachea, bronchus and lung: Secondary | ICD-10-CM | POA: Diagnosis not present

## 2023-07-02 DIAGNOSIS — R739 Hyperglycemia, unspecified: Secondary | ICD-10-CM | POA: Diagnosis not present

## 2023-07-02 DIAGNOSIS — K625 Hemorrhage of anus and rectum: Secondary | ICD-10-CM | POA: Diagnosis not present

## 2023-07-02 DIAGNOSIS — K922 Gastrointestinal hemorrhage, unspecified: Secondary | ICD-10-CM | POA: Insufficient documentation

## 2023-07-02 DIAGNOSIS — K921 Melena: Secondary | ICD-10-CM | POA: Diagnosis present

## 2023-07-02 DIAGNOSIS — I129 Hypertensive chronic kidney disease with stage 1 through stage 4 chronic kidney disease, or unspecified chronic kidney disease: Secondary | ICD-10-CM | POA: Diagnosis present

## 2023-07-02 DIAGNOSIS — I493 Ventricular premature depolarization: Principal | ICD-10-CM | POA: Diagnosis present

## 2023-07-02 DIAGNOSIS — R9431 Abnormal electrocardiogram [ECG] [EKG]: Secondary | ICD-10-CM | POA: Diagnosis not present

## 2023-07-02 LAB — COMPREHENSIVE METABOLIC PANEL
ALT: 20 U/L (ref 0–44)
AST: 29 U/L (ref 15–41)
Albumin: 4.1 g/dL (ref 3.5–5.0)
Alkaline Phosphatase: 55 U/L (ref 38–126)
Anion gap: 9 (ref 5–15)
BUN: 16 mg/dL (ref 8–23)
CO2: 29 mmol/L (ref 22–32)
Calcium: 9.7 mg/dL (ref 8.9–10.3)
Chloride: 100 mmol/L (ref 98–111)
Creatinine, Ser: 1.35 mg/dL — ABNORMAL HIGH (ref 0.61–1.24)
GFR, Estimated: 54 mL/min — ABNORMAL LOW (ref >60)
Glucose, Bld: 196 mg/dL — ABNORMAL HIGH (ref 70–99)
Potassium: 4.3 mmol/L (ref 3.5–5.1)
Sodium: 138 mmol/L (ref 135–145)
Total Bilirubin: 1 mg/dL (ref <1.2)
Total Protein: 7.1 g/dL (ref 6.5–8.1)

## 2023-07-02 LAB — CBC WITH DIFFERENTIAL/PLATELET
Abs Immature Granulocytes: 0.01 10*3/uL (ref 0.00–0.07)
Basophils Absolute: 0 10*3/uL (ref 0.0–0.1)
Basophils Relative: 1 %
Eosinophils Absolute: 0 10*3/uL (ref 0.0–0.5)
Eosinophils Relative: 0 %
HCT: 40.1 % (ref 39.0–52.0)
Hemoglobin: 13.4 g/dL (ref 13.0–17.0)
Immature Granulocytes: 0 %
Lymphocytes Relative: 11 %
Lymphs Abs: 0.6 10*3/uL — ABNORMAL LOW (ref 0.7–4.0)
MCH: 33.4 pg (ref 26.0–34.0)
MCHC: 33.4 g/dL (ref 30.0–36.0)
MCV: 100 fL (ref 80.0–100.0)
Monocytes Absolute: 0.4 10*3/uL (ref 0.1–1.0)
Monocytes Relative: 8 %
Neutro Abs: 4.5 10*3/uL (ref 1.7–7.7)
Neutrophils Relative %: 80 %
Platelets: 151 10*3/uL (ref 150–400)
RBC: 4.01 MIL/uL — ABNORMAL LOW (ref 4.22–5.81)
RDW: 11.6 % (ref 11.5–15.5)
WBC: 5.6 10*3/uL (ref 4.0–10.5)
nRBC: 0 % (ref 0.0–0.2)

## 2023-07-02 LAB — POC OCCULT BLOOD, ED: Fecal Occult Blood: POSITIVE — AB

## 2023-07-02 LAB — URINALYSIS, ROUTINE W REFLEX MICROSCOPIC
Bacteria, UA: NONE SEEN
Bilirubin Urine: NEGATIVE
Glucose, UA: 50 mg/dL — AB
Hgb urine dipstick: NEGATIVE
Ketones, ur: 5 mg/dL — AB
Leukocytes,Ua: NEGATIVE
Nitrite: NEGATIVE
Protein, ur: 30 mg/dL — AB
Specific Gravity, Urine: 1.015 (ref 1.005–1.030)
pH: 6 (ref 5.0–8.0)

## 2023-07-02 LAB — SAMPLE TO BLOOD BANK

## 2023-07-02 LAB — LIPASE, BLOOD: Lipase: 23 U/L (ref 11–51)

## 2023-07-02 MED ORDER — ACETAMINOPHEN 325 MG PO TABS
650.0000 mg | ORAL_TABLET | Freq: Once | ORAL | Status: AC
  Administered 2023-07-02: 650 mg via ORAL
  Filled 2023-07-02: qty 2

## 2023-07-02 MED ORDER — MORPHINE SULFATE (PF) 4 MG/ML IV SOLN
4.0000 mg | Freq: Once | INTRAVENOUS | Status: AC
  Filled 2023-07-02: qty 1

## 2023-07-02 MED ORDER — POLYETHYLENE GLYCOL 3350 17 G PO PACK
17.0000 g | PACK | Freq: Once | ORAL | Status: AC
  Administered 2023-07-02: 17 g via ORAL
  Filled 2023-07-02: qty 1

## 2023-07-02 MED ORDER — IOHEXOL 300 MG/ML  SOLN
80.0000 mL | Freq: Once | INTRAMUSCULAR | Status: AC | PRN
  Administered 2023-07-02: 80 mL via INTRAVENOUS

## 2023-07-02 MED ORDER — ONDANSETRON HCL 4 MG/2ML IJ SOLN
4.0000 mg | Freq: Once | INTRAMUSCULAR | Status: AC
  Administered 2023-07-02: 4 mg via INTRAVENOUS
  Filled 2023-07-02: qty 2

## 2023-07-02 MED ORDER — MORPHINE SULFATE (PF) 4 MG/ML IV SOLN
INTRAVENOUS | Status: AC
  Administered 2023-07-02: 4 mg via INTRAVENOUS
  Filled 2023-07-02: qty 1

## 2023-07-02 MED ORDER — HYDRALAZINE HCL 20 MG/ML IJ SOLN
10.0000 mg | Freq: Once | INTRAMUSCULAR | Status: AC
  Administered 2023-07-02: 10 mg via INTRAMUSCULAR
  Filled 2023-07-02: qty 1

## 2023-07-02 MED ORDER — POLYETHYLENE GLYCOL 3350 17 G PO PACK: 17.0000 g | PACK | Freq: Three times a day (TID) | ORAL | 3 refills | Status: AC

## 2023-07-02 MED ORDER — MECLIZINE HCL 12.5 MG PO TABS
12.5000 mg | ORAL_TABLET | Freq: Once | ORAL | Status: AC
  Administered 2023-07-02: 12.5 mg via ORAL
  Filled 2023-07-02: qty 1

## 2023-07-02 NOTE — ED Triage Notes (Signed)
Pt with recent constipation and taking stool softeners for the past month, took an enema and passed blood in stools today. Denies any blood thinners. Dizziness x 2 days.

## 2023-07-02 NOTE — ED Provider Notes (Signed)
[name]   CSN: [id number] Arrival date & time: 07/02/23  1544     History  Chief Complaint  Patient presents with   GI Problem    Dennis Gilbert is a 77 y.o. male presenting for evaluation of abdominal pain and constipation.  He reports not having a bm in over a week despite taking stool softeners.  States he has had increasing constipation for the past month which responding to the stool softeners until this past week.  He has abdominal distention and left sided pain.  He gave himself an enema today and passed only the enema water, then a large amount of red blood, but no stool.  He denies prior similar sx,  no fevers, chills, n/v.  He has felt dizzy for the past 2 days - has been afraid to eat and make it worse.   The history is provided by the patient and a relative (dg at bedside).       Home Medications Prior to Admission medications   Medication Sig Start Date End Date Taking? Authorizing Provider  polyethylene glycol (MIRALAX) 17 g packet Take 17 g by mouth in the morning, at noon, and at bedtime. Continue using this medicine until your stools have become loose - then may descrease dosing until you have a normal stool without constipation 07/02/23  Yes Makiyla Linch, [name], PA-C  hydrochlorothiazide (HYDRODIURIL) 12.5 MG tablet Take 12.5 mg by mouth daily. 05/13/20   [provider]  losartan (COZAAR) 100 MG tablet Take 100 mg by mouth daily. 03/23/20   [provider]  traMADol (ULTRAM) 50 MG tablet Take 50 mg by mouth daily as needed (Back pain).     [provider]      Allergies    Patient has no known allergies.    Review of Systems   Review of Systems  Constitutional:  Positive for appetite change. Negative for chills and fever.  HENT:  Negative for congestion and sore throat.   Eyes: Negative.   Respiratory:  Negative for chest tightness and shortness of breath.   Cardiovascular:   Negative for chest pain.  Gastrointestinal:  Positive for abdominal pain, blood in stool, constipation and rectal pain. Negative for nausea.  Genitourinary: Negative.   Musculoskeletal:  Negative for arthralgias, joint swelling and neck pain.  Skin: Negative.  Negative for rash and wound.  Neurological:  Negative for dizziness, weakness, light-headedness, numbness and headaches.  Psychiatric/Behavioral: Negative.    All other systems reviewed and are negative.   Physical Exam Updated Vital Signs BP (!) 170/74   Pulse 61   Temp 98.6 F (37 C)   Resp 18   Ht 5\' 6"  (1.676 m)   Wt 61.2 kg   SpO2 98%   BMI 21.79 kg/m  Physical Exam Vitals and nursing note reviewed. Exam conducted with a chaperone present.  Constitutional:      Appearance: He is well-developed.  HENT:     Head: Normocephalic and atraumatic.  Eyes:     Conjunctiva/sclera: Conjunctivae normal.  Cardiovascular:     Rate and Rhythm: Normal rate and regular rhythm.     Heart sounds: Normal heart sounds.  Pulmonary:     Effort: Pulmonary effort is normal.     Breath sounds: Normal breath sounds. No wheezing.  Abdominal:     General: Bowel sounds are normal.     Palpations: Abdomen is soft.     Tenderness: There is no abdominal  tenderness.  Genitourinary:    Rectum: Guaiac result positive. No external hemorrhoid.     Comments: Pink tinged mucoid residue,  strongly hemoccult +.  No rectal impaction. Musculoskeletal:        General: Normal range of motion.     Cervical back: Normal range of motion.  Skin:    General: Skin is warm and dry.  Neurological:     Mental Status: He is alert.     ED Results / Procedures / Treatments   Labs (all labs ordered are listed, but only abnormal results are displayed) Labs Reviewed  CBC WITH DIFFERENTIAL/PLATELET - Abnormal; Notable for the following components:      Result Value   RBC 4.01 (*)    Lymphs Abs 0.6 (*)    All other components within normal limits   COMPREHENSIVE METABOLIC PANEL - Abnormal; Notable for the following components:   Glucose, Bld 196 (*)    Creatinine, Ser 1.35 (*)    GFR, Estimated 54 (*)    All other components within normal limits  URINALYSIS, ROUTINE W REFLEX MICROSCOPIC - Abnormal; Notable for the following components:   Glucose, UA 50 (*)    Ketones, ur 5 (*)    Protein, ur 30 (*)    All other components within normal limits  POC OCCULT BLOOD, ED - Abnormal; Notable for the following components:   Fecal Occult Blood Positive (*)    All other components within normal limits  LIPASE, BLOOD  SAMPLE TO BLOOD BANK    EKG EKG Interpretation Date/Time:  Sunday July 02 2023 16:16:30 EST Ventricular Rate:  72 PR Interval:    QRS Duration:  88 QT Interval:  386 QTC Calculation: 422 R Axis:   62  Text Interpretation: Undetermined rhythm Nonspecific ST abnormality Abnormal ECG When compared with ECG of 10-Nov-1998 18:21, Current undetermined rhythm precludes rhythm comparison, needs review ST depression has replaced ST elevation in Inferior leads ST now depressed in Lateral leads T wave inversion no longer evident in Inferior leads Confirmed by Eber Hong ([id number]) on 07/02/2023 6:09:10 PM  Radiology CT Head Wo Contrast Result Date: 07/02/2023 CLINICAL DATA:  Syncope, dizziness EXAM: CT HEAD WITHOUT CONTRAST TECHNIQUE: Contiguous axial images were obtained from the base of the skull through the vertex without intravenous contrast. RADIATION DOSE REDUCTION: This exam was performed according to the departmental dose-optimization program which includes automated exposure control, adjustment of the mA and/or kV according to patient size and/or use of iterative reconstruction technique. COMPARISON:  08/18/2005 FINDINGS: Brain: There is atrophy and chronic small vessel disease changes. No acute intracranial abnormality. Specifically, no hemorrhage, hydrocephalus, mass lesion, acute infarction, or significant intracranial  injury. Vascular: No hyperdense vessel or unexpected calcification. Skull: No acute calvarial abnormality. Sinuses/Orbits: No acute findings Other: None IMPRESSION: Atrophy, chronic microvascular disease. No acute intracranial abnormality. Electronically Signed   By: Charlett Nose M.D.   On: 07/02/2023 22:08   CT ABDOMEN PELVIS W CONTRAST Result Date: 07/02/2023 CLINICAL DATA:  Left lower quadrant abdominal pain. EXAM: CT ABDOMEN AND PELVIS WITH CONTRAST TECHNIQUE: Multidetector CT imaging of the abdomen and pelvis was performed using the standard protocol following bolus administration of intravenous contrast. RADIATION DOSE REDUCTION: This exam was performed according to the departmental dose-optimization program which includes automated exposure control, adjustment of the mA and/or kV according to patient size and/or use of iterative reconstruction technique. CONTRAST:  80mL OMNIPAQUE IOHEXOL 300 MG/ML  SOLN COMPARISON:  None Available. FINDINGS: Lower chest: The visualized lung bases are  clear. There is coronary vascular calcification. No intra-abdominal free air or free fluid. Hepatobiliary: The liver is unremarkable. There is mild biliary dilatation, post cholecystectomy. No retained calcified stone noted in the central CBD. Pancreas: Unremarkable. No pancreatic ductal dilatation or surrounding inflammatory changes. Spleen: Normal in size without focal abnormality. Adrenals/Urinary Tract: The left adrenal gland is unremarkable. There is a 2 cm fatty lesion in the right adrenal gland, likely a myelolipoma. There is no hydronephrosis on either side. There is symmetric enhancement and excretion of contrast by both kidneys. The visualized ureters appear unremarkable. The urinary bladder is minimally distended and grossly unremarkable. Stomach/Bowel: There is large amount of stool throughout the colon. There is no bowel obstruction or active inflammation. The appendix is normal. Vascular/Lymphatic: Moderate  aortoiliac atherosclerotic disease. The IVC is unremarkable. No portal venous gas. There is no adenopathy. Reproductive: The prostate and seminal vesicles are grossly unremarkable. No pelvic mass. Other: Small fat containing right inguinal hernia. There is slight haziness of the herniated fat which may represent edema. Clinical correlation recommended to evaluate for possibility of strangulation or incarceration. No bowel herniation. Musculoskeletal: Degenerative changes of the spine and multiple spurring/osteophyte. No acute osseous pathology. IMPRESSION: 1. A fat containing right inguinal hernia with possible strangulation or incarceration. Clinical correlation is recommended. No other acute intra-abdominal or pelvic pathology. 2. Constipation.  No bowel obstruction. Normal appendix. 3.  Aortic Atherosclerosis (ICD10-I70.0). Electronically Signed   By: Elgie Collard M.D.   On: 07/02/2023 19:53    Procedures Procedures    Medications Ordered in ED Medications  acetaminophen (TYLENOL) tablet 650 mg (650 mg Oral Given 07/02/23 1820)  ondansetron (ZOFRAN) injection 4 mg (4 mg Intravenous Given 07/02/23 1819)  iohexol (OMNIPAQUE) 300 MG/ML solution 80 mL (80 mLs Intravenous Contrast Given 07/02/23 1934)  morphine (PF) 4 MG/ML injection 4 mg (4 mg Intravenous Given 07/02/23 2012)  polyethylene glycol (MIRALAX / GLYCOLAX) packet 17 g (17 g Oral Given 07/02/23 2015)  ondansetron (ZOFRAN) injection 4 mg (4 mg Intravenous Given 07/02/23 2015)  meclizine (ANTIVERT) tablet 12.5 mg (12.5 mg Oral Given 07/02/23 2212)  hydrALAZINE (APRESOLINE) injection 10 mg (10 mg Intramuscular Given 07/02/23 2212)    ED Course/ Medical Decision Making/ A&P                                 Medical Decision Making Patient presenting with a 1 week history of nausea, reduced p.o. intake and constipation, had passage of blood from his rectum today after given himself an enema which was not effective for eliminating any  stool.  He is not impacted on his exam today and CT imaging is positive for moderate constipation but no signs of impaction.  Plan was for patient to DC to home with MiraLAX, first dose was given here.  Labs are reassuring including a normal hemoglobin level.  At time of discharge when he stood he became very dizzy and unsteady on his feet.  Orthostatics were obtained, he is not orthostatic but he has had episodes of bradycardia throughout this ED visit.  Plan was to have him follow-up as an outpatient with cardiology.  However although I think this is not related to his abdominal pain and constipation, he may have symptomatic bradycardia, his rates or blood pressures do not significantly vary with orthostatic maneuvers.  In fact, his blood pressure has remained quite high while here.  He is compliant with his blood pressure medication,  but endorses whitecoat syndrome.  Given unsteadiness with standing, CT head has been ordered to rule out acute intracranial process.  Patient has returned from CT imaging which is negative for any acute or chronic findings.  He has been given an IM dose of hydralazine and p.o. meclizine.  His blood pressure has started to improve at last measurement it was 174/86.  He continues to have dizziness with attempts at standing.  We will plan admission for this patient, he may benefit from further treatment of his blood pressure and MRI imaging in the morning to rule out central source of his dizziness.  He also continues to have abdominal pain, will continue to treat his constipation.    Amount and/or Complexity of Data Reviewed Labs: ordered.    Details: Labs reviewed, urinalysis is unremarkable, normal lipase, his c-Met is significant for a glucose of 196, creatinine is 1.35.  His CBC is stable, normal hemoglobin at 13.4.  I had no prior labs for comparison. Radiology: ordered.    Details: CT abdomen and pelvis reviewed, he has a fat-containing right inguinal hernia, he  has no pain at this site and this is a chronic finding for him.  Significant constipation. Discussion of management or test interpretation with external provider(s): Call placed to the hospitalist for admission.  Discussed with Dr. Thomes Dinning who accepts pt for admission.   Risk Decision regarding hospitalization.           Final Clinical Impression(s) / ED Diagnoses Final diagnoses:  Constipation, unspecified constipation type  Bradycardia  Hypertensive urgency  Vertigo    Rx / DC Orders ED Discharge Orders          Ordered    polyethylene glycol (MIRALAX) 17 g packet  3 times daily        07/02/23 2031    Ambulatory referral to Cardiology       Comments: If you have not heard from the Cardiology office within the next 72 hours please call [phone].   07/02/23 2032              Burgess Amor, PA-C 07/02/23 2317    Eber Hong, MD 07/03/23 [phone]

## 2023-07-02 NOTE — ED Notes (Signed)
EKG completed and given to [facility], admitting - at bedside currently.

## 2023-07-02 NOTE — H&P (Signed)
History and Physical    Patient: Dennis Gilbert JYN:[id number] DOB: [birth year] DOA: 07/02/2023 DOS: the patient was seen and examined on 07/03/2023 PCP: Benita Stabile, MD  Patient coming from: Home  Chief Complaint:  Chief Complaint  Patient presents with   GI Problem   HPI: Dennis Gilbert is a 77 y.o. male with medical history significant of hypertension who presents to the emergency department accompanied by daughter or son-in-law due to abdominal pain and constipation.  Most of the history was obtained from daughter at bedside, per report, patient complained of 1 month history of constipation which responded to stool softeners until 1 week ago when he last have a bowel movement.  He complained of left-patient abdominal pain with distention, he used an enema today and shortly after this, he passed a large amount of blood without stool in the commode.  He also complained of 2-day onset of dizziness which occurs mostly when he stands up from laying in the bed, he describes this as" swimming in the head ".  He endorsed mild similar sensation when he moves his head from right to left.  He has had decreased food and fluid intake due to being afraid of worsening abdominal pain.  ED Course:  In the emergency department, patient presents with bradycardia with HR of 44, BP was elevated at 192/62, other vital signs are within normal range.  Workup in the ED showed normal CBC, BMP was normal except for blood glucose of 196 and creatinine of 1.35, lipase 23, urinalysis was normal, FOBT was positive. CTA head without contrast showed no acute intracranial abnormality CT abdomen and pelvis with contrast showed a fat-containing right inguinal hernia with possible strangulation or incarceration.  Constipation, no bowel obstruction.  Normal appendix. Hydralazine was given due to elevated BP.  Meclizine was given due to patient's dizziness.  Zofran, morphine and Tylenol were given.  MiraLAX was also  provided. Hospitalist was asked to admit patient for further evaluation and management.  Review of Systems: Review of systems as noted in the HPI. All other systems reviewed and are negative.   Past Medical History:  Diagnosis Date   Bulging lumbar disc    Chronic kidney disease (CKD)    HTN (hypertension)    Hyperlipidemia    Past Surgical History:  Procedure Laterality Date   BACK SURGERY     CHOLECYSTECTOMY     COLONOSCOPY WITH PROPOFOL N/A 05/18/2020   Procedure: COLONOSCOPY WITH PROPOFOL;  Surgeon: Lanelle Bal, DO;  Location: [facility];  Service: Endoscopy;  Laterality: N/A;  11:45am   NECK SURGERY     POLYPECTOMY  05/18/2020   Procedure: POLYPECTOMY INTESTINAL;  Surgeon: Lanelle Bal, DO;  Location: [facility];  Service: Endoscopy;;    Social History:  reports that he has never smoked. He has never used smokeless tobacco. He reports that he does not currently use alcohol. He reports that he does not currently use drugs.   No Known Allergies  Family History  Problem Relation Age of Onset   Lung cancer Mother 81   Heart attack Father 38   Colon cancer Neg Hx    Colon polyps Neg Hx      Prior to Admission medications   Medication Sig Start Date End Date Taking? Authorizing Provider  polyethylene glycol (MIRALAX) 17 g packet Take 17 g by mouth in the morning, at noon, and at bedtime. Continue using this medicine until your stools have become loose - then may  descrease dosing until you have a normal stool without constipation 07/02/23  Yes [name], [name], PA-C  hydrochlorothiazide (HYDRODIURIL) 12.5 MG tablet Take 12.5 mg by mouth daily. 05/13/20   [provider]  losartan (COZAAR) 100 MG tablet Take 100 mg by mouth daily. 03/23/20   [provider]  traMADol (ULTRAM) 50 MG tablet Take 50 mg by mouth daily as needed (Back pain).     [provider]    Physical Exam: BP (!) 153/80 (BP Location: Left Arm)   Pulse 62   Temp 98.3 F  (36.8 C) (Oral)   Resp 16   Ht 5\' 6"  (1.676 m)   Wt 61.2 kg   SpO2 100%   BMI 21.79 kg/m   General: 77 y.o. year-old male well developed well nourished in no acute distress.  Alert and oriented x3. HEENT: NCAT, EOMI, dry mucous membrane Neck: Supple, trachea medial Cardiovascular: Regular rate and rhythm with no rubs or gallops.  No thyromegaly or JVD noted.  No lower extremity edema. 2/4 pulses in all 4 extremities. Respiratory: Clear to auscultation with no wheezes or rales. Good inspiratory effort. Abdomen: Soft, tender to palpation.  Nondistended with decreased bowel sounds x4 quadrants. Muskuloskeletal: No cyanosis, clubbing or edema noted bilaterally Neuro: CN II-XII intact, strength 5/5 x 4, sensation, reflexes intact Skin: No ulcerative lesions noted or rashes Psychiatry: Judgement and insight appear normal. Mood is appropriate for condition and setting          Labs on Admission:  Basic Metabolic Panel: Recent Labs  Lab 07/02/23 1816  NA 138  K 4.3  CL 100  CO2 29  GLUCOSE 196*  BUN 16  CREATININE 1.35*  CALCIUM 9.7   Liver Function Tests: Recent Labs  Lab 07/02/23 1816  AST 29  ALT 20  ALKPHOS 55  BILITOT 1.0  PROT 7.1  ALBUMIN 4.1   Recent Labs  Lab 07/02/23 1816  LIPASE 23   No results for input(s): "AMMONIA" in the last 168 hours. CBC: Recent Labs  Lab 07/02/23 1816  WBC 5.6  NEUTROABS 4.5  HGB 13.4  HCT 40.1  MCV 100.0  PLT 151   Cardiac Enzymes: No results for input(s): "CKTOTAL", "CKMB", "CKMBINDEX", "TROPONINI" in the last 168 hours.  BNP (last 3 results) No results for input(s): "BNP" in the last 8760 hours.  ProBNP (last 3 results) No results for input(s): "PROBNP" in the last 8760 hours.  CBG: Recent Labs  Lab 07/03/23 0055  GLUCAP 130*    Radiological Exams on Admission: CT Head Wo Contrast Result Date: 07/02/2023 CLINICAL DATA:  Syncope, dizziness EXAM: CT HEAD WITHOUT CONTRAST TECHNIQUE: Contiguous axial images  were obtained from the base of the skull through the vertex without intravenous contrast. RADIATION DOSE REDUCTION: This exam was performed according to the departmental dose-optimization program which includes automated exposure control, adjustment of the mA and/or kV according to patient size and/or use of iterative reconstruction technique. COMPARISON:  08/18/2005 FINDINGS: Brain: There is atrophy and chronic small vessel disease changes. No acute intracranial abnormality. Specifically, no hemorrhage, hydrocephalus, mass lesion, acute infarction, or significant intracranial injury. Vascular: No hyperdense vessel or unexpected calcification. Skull: No acute calvarial abnormality. Sinuses/Orbits: No acute findings Other: None IMPRESSION: Atrophy, chronic microvascular disease. No acute intracranial abnormality. Electronically Signed   By: Charlett Nose M.D.   On: 07/02/2023 22:08   CT ABDOMEN PELVIS W CONTRAST Result Date: 07/02/2023 CLINICAL DATA:  Left lower quadrant abdominal pain. EXAM: CT ABDOMEN AND PELVIS WITH CONTRAST  TECHNIQUE: Multidetector CT imaging of the abdomen and pelvis was performed using the standard protocol following bolus administration of intravenous contrast. RADIATION DOSE REDUCTION: This exam was performed according to the departmental dose-optimization program which includes automated exposure control, adjustment of the mA and/or kV according to patient size and/or use of iterative reconstruction technique. CONTRAST:  80mL OMNIPAQUE IOHEXOL 300 MG/ML  SOLN COMPARISON:  None Available. FINDINGS: Lower chest: The visualized lung bases are clear. There is coronary vascular calcification. No intra-abdominal free air or free fluid. Hepatobiliary: The liver is unremarkable. There is mild biliary dilatation, post cholecystectomy. No retained calcified stone noted in the central CBD. Pancreas: Unremarkable. No pancreatic ductal dilatation or surrounding inflammatory changes. Spleen: Normal in  size without focal abnormality. Adrenals/Urinary Tract: The left adrenal gland is unremarkable. There is a 2 cm fatty lesion in the right adrenal gland, likely a myelolipoma. There is no hydronephrosis on either side. There is symmetric enhancement and excretion of contrast by both kidneys. The visualized ureters appear unremarkable. The urinary bladder is minimally distended and grossly unremarkable. Stomach/Bowel: There is large amount of stool throughout the colon. There is no bowel obstruction or active inflammation. The appendix is normal. Vascular/Lymphatic: Moderate aortoiliac atherosclerotic disease. The IVC is unremarkable. No portal venous gas. There is no adenopathy. Reproductive: The prostate and seminal vesicles are grossly unremarkable. No pelvic mass. Other: Small fat containing right inguinal hernia. There is slight haziness of the herniated fat which may represent edema. Clinical correlation recommended to evaluate for possibility of strangulation or incarceration. No bowel herniation. Musculoskeletal: Degenerative changes of the spine and multiple spurring/osteophyte. No acute osseous pathology. IMPRESSION: 1. A fat containing right inguinal hernia with possible strangulation or incarceration. Clinical correlation is recommended. No other acute intra-abdominal or pelvic pathology. 2. Constipation.  No bowel obstruction. Normal appendix. 3.  Aortic Atherosclerosis (ICD10-I70.0). Electronically Signed   By: Elgie Collard M.D.   On: 07/02/2023 19:53    EKG: I independently viewed the EKG done and my findings are as followed: Normal sinus rhythm at a rate of 84 bpm  Assessment/Plan Present on Admission: **None**  Principal Problem:   Dizziness of unknown cause Active Problems:   Abdominal pain   Nausea   Constipation   Dehydration   Essential hypertension   GI bleed   Sinus bradycardia   Hyperglycemia  Dizziness of unknown cause CT head without contrast showed no acute  intracranial abnormality Patient's dizziness appears to be more orthostatic in nature Orthostatic vital signs were negative PT/OT will be consulted to rule out/eval and treat vertigo  Abdominal pain and nausea possibly due to constipation Continue IV fentanyl 25 mcg every 2 hours as needed Continue Zofran as needed Continue MiraLAX, Dulcolax when patient resumes oral intake  Dehydration Continue IV hydration  Essential hypertension Continue IV hydralazine 10 mg every 6 hours as needed for SBP > 170  Intermittent bradycardia Patient presents with intermittent bradycardia with HR ranging from 36 -57 in the first few hours in the ED Continue telemetry Consider cardiology consult for any symptomatic bradycardia  GI bleed Patient complained of an episode of large amount of blood after attempting bowel movement at home ?  Diverticulosis Last colonoscopy in chart by Dr. Marletta Lor was on 05/18/2020 and showed diverticulosis of large intestine without perforation or abscess without bleeding.  Polyp of colon, other hemorrhoids. Gastroenterology will be consulted and we shall await further recommendations  Hyperglycemia with no known history of T2DM CBG 196, this may be a reactive process  Hemoglobin A1c will be checked to rule out undiagnosed T2DM  Elevated creatinine with no known history of CKD Creatinine 1.35, no prior labs for comparison Continue to monitor creatinine  DVT prophylaxis: SCDs  Code Status: Full code  Family Communication: Daughter and son-in-law at bedside (all questions answered to satisfaction)  Consults: Gastroenterology  Severity of Illness: The appropriate patient status for this patient is OBSERVATION. Observation status is judged to be reasonable and necessary in order to provide the required intensity of service to ensure the patient's safety. The patient's presenting symptoms, physical exam findings, and initial radiographic and laboratory data in the context  of their medical condition is felt to place them at decreased risk for further clinical deterioration. Furthermore, it is anticipated that the patient will be medically stable for discharge from the hospital within 2 midnights of admission.   Author: Frankey Shown, DO 07/03/2023 2:48 AM  For on call review [email or web address].

## 2023-07-02 NOTE — Discharge Instructions (Signed)
Use the medicine prescribed for your constipation.  It will cause loose to watery stools if taken long enough (several days) after which you can reduce the dosing that is right for you - (soft stools without needing to strain).  It is safe to take daily.  Your pulse rate was very low while here for a time and you would benefit from seeing a cardiologist for this.  You have been referred to cardiology who should call you to arrange an appointment within the next several weeks.

## 2023-07-03 ENCOUNTER — Other Ambulatory Visit (HOSPITAL_COMMUNITY): Payer: Self-pay | Admitting: *Deleted

## 2023-07-03 ENCOUNTER — Encounter (HOSPITAL_COMMUNITY): Payer: Self-pay | Admitting: Internal Medicine

## 2023-07-03 ENCOUNTER — Observation Stay (HOSPITAL_COMMUNITY): Payer: Medicare Other

## 2023-07-03 ENCOUNTER — Telehealth: Payer: Self-pay | Admitting: Gastroenterology

## 2023-07-03 DIAGNOSIS — D696 Thrombocytopenia, unspecified: Secondary | ICD-10-CM | POA: Diagnosis present

## 2023-07-03 DIAGNOSIS — K921 Melena: Secondary | ICD-10-CM | POA: Diagnosis present

## 2023-07-03 DIAGNOSIS — I129 Hypertensive chronic kidney disease with stage 1 through stage 4 chronic kidney disease, or unspecified chronic kidney disease: Secondary | ICD-10-CM | POA: Diagnosis present

## 2023-07-03 DIAGNOSIS — I10 Essential (primary) hypertension: Secondary | ICD-10-CM | POA: Insufficient documentation

## 2023-07-03 DIAGNOSIS — E785 Hyperlipidemia, unspecified: Secondary | ICD-10-CM | POA: Diagnosis present

## 2023-07-03 DIAGNOSIS — R42 Dizziness and giddiness: Secondary | ICD-10-CM | POA: Diagnosis not present

## 2023-07-03 DIAGNOSIS — R1032 Left lower quadrant pain: Secondary | ICD-10-CM | POA: Diagnosis not present

## 2023-07-03 DIAGNOSIS — R109 Unspecified abdominal pain: Secondary | ICD-10-CM | POA: Insufficient documentation

## 2023-07-03 DIAGNOSIS — K4090 Unilateral inguinal hernia, without obstruction or gangrene, not specified as recurrent: Secondary | ICD-10-CM

## 2023-07-03 DIAGNOSIS — Z801 Family history of malignant neoplasm of trachea, bronchus and lung: Secondary | ICD-10-CM | POA: Diagnosis not present

## 2023-07-03 DIAGNOSIS — R739 Hyperglycemia, unspecified: Secondary | ICD-10-CM | POA: Diagnosis present

## 2023-07-03 DIAGNOSIS — R9431 Abnormal electrocardiogram [ECG] [EKG]: Secondary | ICD-10-CM | POA: Diagnosis not present

## 2023-07-03 DIAGNOSIS — I493 Ventricular premature depolarization: Secondary | ICD-10-CM

## 2023-07-03 DIAGNOSIS — K5900 Constipation, unspecified: Secondary | ICD-10-CM

## 2023-07-03 DIAGNOSIS — Z79899 Other long term (current) drug therapy: Secondary | ICD-10-CM | POA: Diagnosis not present

## 2023-07-03 DIAGNOSIS — Z8249 Family history of ischemic heart disease and other diseases of the circulatory system: Secondary | ICD-10-CM | POA: Diagnosis not present

## 2023-07-03 DIAGNOSIS — N1831 Chronic kidney disease, stage 3a: Secondary | ICD-10-CM | POA: Diagnosis present

## 2023-07-03 DIAGNOSIS — K625 Hemorrhage of anus and rectum: Secondary | ICD-10-CM

## 2023-07-03 DIAGNOSIS — R001 Bradycardia, unspecified: Secondary | ICD-10-CM | POA: Diagnosis present

## 2023-07-03 DIAGNOSIS — E860 Dehydration: Secondary | ICD-10-CM | POA: Insufficient documentation

## 2023-07-03 DIAGNOSIS — D539 Nutritional anemia, unspecified: Secondary | ICD-10-CM | POA: Diagnosis present

## 2023-07-03 DIAGNOSIS — K922 Gastrointestinal hemorrhage, unspecified: Secondary | ICD-10-CM | POA: Insufficient documentation

## 2023-07-03 DIAGNOSIS — I160 Hypertensive urgency: Secondary | ICD-10-CM | POA: Diagnosis present

## 2023-07-03 DIAGNOSIS — R110 Nausea: Secondary | ICD-10-CM | POA: Insufficient documentation

## 2023-07-03 LAB — COMPREHENSIVE METABOLIC PANEL
ALT: 20 U/L (ref 0–44)
AST: 26 U/L (ref 15–41)
Albumin: 3.7 g/dL (ref 3.5–5.0)
Alkaline Phosphatase: 49 U/L (ref 38–126)
Anion gap: 6 (ref 5–15)
BUN: 16 mg/dL (ref 8–23)
CO2: 30 mmol/L (ref 22–32)
Calcium: 9.2 mg/dL (ref 8.9–10.3)
Chloride: 100 mmol/L (ref 98–111)
Creatinine, Ser: 1.29 mg/dL — ABNORMAL HIGH (ref 0.61–1.24)
GFR, Estimated: 57 mL/min — ABNORMAL LOW (ref >60)
Glucose, Bld: 144 mg/dL — ABNORMAL HIGH (ref 70–99)
Potassium: 4.3 mmol/L (ref 3.5–5.1)
Sodium: 136 mmol/L (ref 135–145)
Total Bilirubin: 0.8 mg/dL (ref <1.2)
Total Protein: 6.5 g/dL (ref 6.5–8.1)

## 2023-07-03 LAB — ECHOCARDIOGRAM COMPLETE
Area-P 1/2: 2.99 cm2
Height: 66 in
S' Lateral: 3 cm
Weight: 2160 [oz_av]

## 2023-07-03 LAB — HEMOGLOBIN A1C
Hgb A1c MFr Bld: 5.6 % (ref 4.8–5.6)
Mean Plasma Glucose: 114.02 mg/dL

## 2023-07-03 LAB — TSH: TSH: 1.626 u[IU]/mL (ref 0.350–4.500)

## 2023-07-03 LAB — CBC
HCT: 37.8 % — ABNORMAL LOW (ref 39.0–52.0)
Hemoglobin: 12.7 g/dL — ABNORMAL LOW (ref 13.0–17.0)
MCH: 34 pg (ref 26.0–34.0)
MCHC: 33.6 g/dL (ref 30.0–36.0)
MCV: 101.1 fL — ABNORMAL HIGH (ref 80.0–100.0)
Platelets: 140 10*3/uL — ABNORMAL LOW (ref 150–400)
RBC: 3.74 MIL/uL — ABNORMAL LOW (ref 4.22–5.81)
RDW: 11.7 % (ref 11.5–15.5)
WBC: 6.7 10*3/uL (ref 4.0–10.5)
nRBC: 0 % (ref 0.0–0.2)

## 2023-07-03 LAB — PHOSPHORUS: Phosphorus: 3.1 mg/dL (ref 2.5–4.6)

## 2023-07-03 LAB — GLUCOSE, CAPILLARY: Glucose-Capillary: 130 mg/dL — ABNORMAL HIGH (ref 70–99)

## 2023-07-03 LAB — MAGNESIUM: Magnesium: 2.4 mg/dL (ref 1.7–2.4)

## 2023-07-03 MED ORDER — PEG 3350-KCL-NA BICARB-NACL 420 G PO SOLR
4000.0000 mL | Freq: Once | ORAL | Status: AC
  Administered 2023-07-03: 4000 mL via ORAL
  Filled 2023-07-03: qty 4000

## 2023-07-03 MED ORDER — CARVEDILOL 3.125 MG PO TABS
3.1250 mg | ORAL_TABLET | Freq: Two times a day (BID) | ORAL | Status: DC
  Administered 2023-07-03 – 2023-07-04 (×2): 3.125 mg via ORAL
  Filled 2023-07-03 (×2): qty 1

## 2023-07-03 MED ORDER — ONDANSETRON HCL 4 MG/2ML IJ SOLN
4.0000 mg | Freq: Four times a day (QID) | INTRAMUSCULAR | Status: DC | PRN
  Administered 2023-07-03: 4 mg via INTRAVENOUS
  Filled 2023-07-03: qty 2

## 2023-07-03 MED ORDER — POLYETHYLENE GLYCOL 3350 17 G PO PACK
17.0000 g | PACK | Freq: Every day | ORAL | Status: DC
  Filled 2023-07-03: qty 1

## 2023-07-03 MED ORDER — SODIUM CHLORIDE 0.9 % IV SOLN: INTRAVENOUS | Status: DC

## 2023-07-03 MED ORDER — SODIUM CHLORIDE 0.9 % IV SOLN: INTRAVENOUS | Status: AC

## 2023-07-03 MED ORDER — ONDANSETRON HCL 4 MG PO TABS
4.0000 mg | ORAL_TABLET | Freq: Four times a day (QID) | ORAL | Status: DC | PRN
  Administered 2023-07-03: 4 mg via ORAL
  Filled 2023-07-03: qty 1

## 2023-07-03 MED ORDER — BISACODYL 10 MG RE SUPP
10.0000 mg | Freq: Once | RECTAL | Status: AC
  Administered 2023-07-03: 10 mg via RECTAL
  Filled 2023-07-03: qty 1

## 2023-07-03 MED ORDER — HYDRALAZINE HCL 20 MG/ML IJ SOLN
10.0000 mg | Freq: Four times a day (QID) | INTRAMUSCULAR | Status: DC | PRN
  Administered 2023-07-03: 10 mg via INTRAVENOUS
  Filled 2023-07-03: qty 1

## 2023-07-03 MED ORDER — ORAL CARE MOUTH RINSE: 15.0000 mL | OROMUCOSAL | Status: DC | PRN

## 2023-07-03 MED ORDER — FENTANYL CITRATE PF 50 MCG/ML IJ SOSY: 25.0000 ug | PREFILLED_SYRINGE | INTRAMUSCULAR | Status: DC | PRN

## 2023-07-03 MED ORDER — BISACODYL 5 MG PO TBEC: 5.0000 mg | DELAYED_RELEASE_TABLET | Freq: Every day | ORAL | Status: DC | PRN

## 2023-07-03 MED ORDER — TRAMADOL HCL 50 MG PO TABS
50.0000 mg | ORAL_TABLET | Freq: Every day | ORAL | Status: DC | PRN
  Administered 2023-07-03: 50 mg via ORAL
  Filled 2023-07-03: qty 1

## 2023-07-03 NOTE — Plan of Care (Signed)

## 2023-07-03 NOTE — Plan of Care (Signed)
  Problem: Acute Rehab PT Goals(only PT should resolve) Goal: Pt Will Go Supine/Side To Sit Outcome: Progressing Flowsheets (Taken 07/03/2023 1553) Pt will go Supine/Side to Sit:  Independently  with modified independence Goal: Patient Will Transfer Sit To/From Stand Outcome: Progressing Flowsheets (Taken 07/03/2023 1553) Patient will transfer sit to/from stand:  Independently  with modified independence Goal: Pt Will Transfer Bed To Chair/Chair To Bed Outcome: Progressing Flowsheets (Taken 07/03/2023 1553) Pt will Transfer Bed to Chair/Chair to Bed:  with supervision  with contact guard assist Goal: Pt Will Ambulate Outcome: Progressing Flowsheets (Taken 07/03/2023 1553) Pt will Ambulate:  with supervision  with contact guard assist  50 feet   3:53 PM, 07/03/23 Ocie Bob, MPT Physical Therapist with [facility] [phone] office [phone] mobile phone

## 2023-07-03 NOTE — Evaluation (Signed)
Occupational Therapy Evaluation Patient Details Name: Dennis Gilbert MRN: [id number] DOB: 20-Oct-1945 Today's Date: 07/03/2023   History of Present Illness Dennis Gilbert is a 77 y.o. male with medical history significant of hypertension who presents to the emergency department accompanied by daughter or son-in-law due to abdominal pain and constipation.  Most of the history was obtained from daughter at bedside, per report, patient complained of 1 month history of constipation which responded to stool softeners until 1 week ago when he last have a bowel movement.  He complained of left-patient abdominal pain with distention, he used an enema today and shortly after this, he passed a large amount of blood without stool in the commode.  He also complained of 2-day onset of dizziness which occurs mostly when he stands up from laying in the bed, he describes this as" swimming in the head ".  He endorsed mild similar sensation when he moves his head from right to left.  He has had decreased food and fluid intake due to being afraid of worsening abdominal pain. (per DO)   Clinical Impression   Pt agreeable to OT and PT co-evaluation. Pt limited by dizziness that began in sitting and seemed to increase in standing. Pt's seated blood pressure was 180/91 which is not normal for the pt. Nursing notified. Session limited due to blood pressure and dizziness. Pt does well with seated ADL's, but is unsteady in standing likely due to the dizziness. Pt will benefit from continued OT in the hospital and recommended venue below to increase strength, balance, and endurance for safe ADL's.         If plan is discharge home, recommend the following: A little help with walking and/or transfers;A little help with bathing/dressing/bathroom;Assistance with cooking/housework;Assist for transportation;Help with stairs or ramp for entrance    Functional Status Assessment  Patient has had a recent decline in their functional  status and demonstrates the ability to make significant improvements in function in a reasonable and predictable amount of time.  Equipment Recommendations  None recommended by OT           Precautions / Restrictions Precautions Precautions: Fall Restrictions Weight Bearing Restrictions Per Provider Order: No      Mobility Bed Mobility Overal bed mobility: Modified Independent             General bed mobility comments: No physical assist needed.    Transfers Overall transfer level: Needs assistance   Transfers: Sit to/from Stand Sit to Stand: Min assist           General transfer comment: Able to stand briefly without RW. Min A to balance while standing from EOB; unsteady with reports of dizziness.      Balance Overall balance assessment: Needs assistance Sitting-balance support: No upper extremity supported, Feet supported Sitting balance-Leahy Scale: Fair Sitting balance - Comments: fairt to good seated at EOB   Standing balance support: No upper extremity supported, During functional activity Standing balance-Leahy Scale: Poor Standing balance comment: poor without AD                           ADL either performed or assessed with clinical judgement   ADL Overall ADL's : Needs assistance/impaired     Grooming: Set up;Sitting;Modified independent   Upper Body Bathing: Set up;Sitting;Modified independent   Lower Body Bathing: Set up;Sitting/lateral leans;Modified independent   Upper Body Dressing : Modified independent;Set up;Sitting   Lower Body Dressing: Modified independent;Set  up;Sitting/lateral leans Lower Body Dressing Details (indicate cue type and reason): Able to don socks seated at EOB without difficulty, but did report dizziness when sitting up. Toilet Transfer: Moderate assistance;Minimal assistance;Stand-pivot Toilet Transfer Details (indicate cue type and reason): Based on brief sit to stand from EOB with pt severely limtied  by dizziness. Toileting- Clothing Manipulation and Hygiene: Set up;Sitting/lateral lean;Contact guard assist       Functional mobility during ADLs: Minimal assistance;Rolling walker (2 wheels) General ADL Comments: Pt took a couple steps away from the bed then walked backwards back to bed.     Vision Baseline Vision/History: 1 Wears glasses Ability to See in Adequate Light: 1 Impaired Patient Visual Report: Other (comment) ("room spinning" when dizzy) Vision Assessment?: No apparent visual deficits     Perception Perception: Not tested       Praxis Praxis: Not tested       Pertinent Vitals/Pain Pain Assessment Pain Assessment: Faces Faces Pain Scale: Hurts little more Pain Location: stomach Pain Descriptors / Indicators: Grimacing, Guarding Pain Intervention(s): Limited activity within patient's tolerance, Monitored during session, Repositioned     Extremity/Trunk Assessment Upper Extremity Assessment Upper Extremity Assessment: Generalized weakness (Limited shoulder flexion at upper range of motion bilaterally. Likely baseline issue. Good strength otherwise.)   Lower Extremity Assessment Lower Extremity Assessment: Defer to PT evaluation   Cervical / Trunk Assessment Cervical / Trunk Assessment: Normal   Communication Communication Communication: No apparent difficulties   Cognition Arousal: Alert Behavior During Therapy: WFL for tasks assessed/performed Overall Cognitive Status: Within Functional Limits for tasks assessed                                                        Home Living Family/patient expects to be discharged to:: Private residence Living Arrangements: Alone Available Help at Discharge: Family;Available PRN/intermittently (Daughter reports she could have 24/7 if needed.) Type of Home: Mobile home Home Access: Stairs to enter [employer] of Steps: 5 Entrance Stairs-Rails: Can reach both Home Layout: One  level     Bathroom Shower/Tub: Chief Strategy Officer: Standard Bathroom Accessibility: Yes How Accessible: Accessible via wheelchair;Accessible via walker Home Equipment: Rolling Walker (2 wheels);Cane - quad          Prior Functioning/Environment Prior Level of Function : Independent/Modified Independent             Mobility Comments: Ambulates without AD; drives ADLs Comments: Independent        OT Problem List: Impaired balance (sitting and/or standing);Decreased activity tolerance      OT Treatment/Interventions: Self-care/ADL training;Therapeutic exercise;Therapeutic activities;Balance training    OT Goals(Current goals can be found in the care plan section) Acute Rehab OT Goals Patient Stated Goal: return home OT Goal Formulation: With patient Time For Goal Achievement: 07/17/23 Potential to Achieve Goals: Good  OT Frequency: Min 1X/week    Co-evaluation PT/OT/SLP Co-Evaluation/Treatment: Yes Reason for Co-Treatment: To address functional/ADL transfers   OT goals addressed during session: ADL's and self-care      AM-PAC OT "6 Clicks" Daily Activity     Outcome Measure Help from another person eating meals?: None Help from another person taking care of personal grooming?: A Little Help from another person toileting, which includes using toliet, bedpan, or urinal?: A Little Help from another person bathing (including washing, rinsing, drying)?:  A Little Help from another person to put on and taking off regular upper body clothing?: None Help from another person to put on and taking off regular lower body clothing?: A Little 6 Click Score: 20   End of Session Equipment Utilized During Treatment: Gait belt  Activity Tolerance: Other (comment) (Limited by dizziness) Patient left: in bed;with call bell/phone within reach;with family/visitor present  OT Visit Diagnosis: Unsteadiness on feet (R26.81);Other abnormalities of gait and mobility  (R26.89)                Time: 1610-9604 OT Time Calculation (min): 22 min Charges:  OT General Charges $OT Visit: 1 Visit OT Evaluation $OT Eval Low Complexity: 1 Low  Montrelle Eddings OT, MOT   Danie Chandler 07/03/2023, 11:57 AM

## 2023-07-03 NOTE — Evaluation (Signed)
Physical Therapy Evaluation Patient Details Name: Dennis Gilbert MRN: [id number] DOB: [birth year] Today's Date: 07/03/2023  History of Present Illness  Dennis Gilbert is a 77 y.o. male with medical history significant of hypertension who presents to the emergency department accompanied by daughter or son-in-law due to abdominal pain and constipation.  Most of the history was obtained from daughter at bedside, per report, patient complained of 1 month history of constipation which responded to stool softeners until 1 week ago when he last have a bowel movement.  He complained of left-patient abdominal pain with distention, he used an enema today and shortly after this, he passed a large amount of blood without stool in the commode.  He also complained of 2-day onset of dizziness which occurs mostly when he stands up from laying in the bed, he describes this as" swimming in the head ".  He endorsed mild similar sensation when he moves his head from right to left.  He has had decreased food and fluid intake due to being afraid of worsening abdominal pain.   Clinical Impression  Patient limited for activities mostly due to c/o nausea, dizziness and stomach pain, able to stand without AD, but had to lean forward mostly due to stomach discomfort and unable to walk away from bedside.  Patient noted to have higher BP than normal - RN notified.  Patient will benefit from continued skilled physical therapy in hospital and recommended venue below to increase strength, balance, endurance for safe ADLs and gait.         If plan is discharge home, recommend the following: A little help with walking and/or transfers;A little help with bathing/dressing/bathroom;Help with stairs or ramp for entrance;Two people to help with walking and/or transfers;Assistance with cooking/housework   Can travel by private vehicle   Yes    Equipment Recommendations None recommended by PT  Recommendations for Other Services        Functional Status Assessment Patient has had a recent decline in their functional status and demonstrates the ability to make significant improvements in function in a reasonable and predictable amount of time.     Precautions / Restrictions Precautions Precautions: Fall Restrictions Weight Bearing Restrictions Per Provider Order: No      Mobility  Bed Mobility Overal bed mobility: Modified Independent                  Transfers Overall transfer level: Needs assistance   Transfers: Sit to/from Stand Sit to Stand: Min assist           General transfer comment: unsteady labored movement having to lean bed rail for support without AD, required use of RW for safety    Ambulation/Gait Ambulation/Gait assistance: Min assist Gait Distance (Feet): 4 Feet Assistive device: Rolling walker (2 wheels) Gait Pattern/deviations: Decreased step length - right, Decreased step length - left, Decreased stride length Gait velocity: slow     General Gait Details: limited to a few steps at bedside mostly due to feeling nausea, stomach ache, dizziness  Stairs            Wheelchair Mobility     Tilt Bed    Modified Rankin (Stroke Patients Only)       Balance Overall balance assessment: Needs assistance Sitting-balance support: Feet supported, No upper extremity supported Sitting balance-Leahy Scale: Fair Sitting balance - Comments: fair/good seated at EOB   Standing balance support: During functional activity, No upper extremity supported Standing balance-Leahy Scale: Poor Standing balance comment: fair  using RW                             Pertinent Vitals/Pain Pain Assessment Pain Assessment: Faces Faces Pain Scale: Hurts little more Pain Location: stomach Pain Descriptors / Indicators: Grimacing, Guarding Pain Intervention(s): Limited activity within patient's tolerance, Monitored during session, Repositioned    Home Living Family/patient  expects to be discharged to:: Private residence Living Arrangements: Alone Available Help at Discharge: Family;Available PRN/intermittently Type of Home: Mobile home Home Access: Stairs to enter Entrance Stairs-Rails: Can reach both Entrance Stairs-Number of Steps: 5   Home Layout: One level Home Equipment: Agricultural consultant (2 wheels);Cane - quad      Prior Function Prior Level of Function : Independent/Modified Independent             Mobility Comments: Ambulates without AD; drives ADLs Comments: Independent     Extremity/Trunk Assessment   Upper Extremity Assessment Upper Extremity Assessment: Defer to OT evaluation    Lower Extremity Assessment Lower Extremity Assessment: Generalized weakness    Cervical / Trunk Assessment Cervical / Trunk Assessment: Normal  Communication   Communication Communication: No apparent difficulties  Cognition Arousal: Alert Behavior During Therapy: WFL for tasks assessed/performed Overall Cognitive Status: Within Functional Limits for tasks assessed                                          General Comments      Exercises     Assessment/Plan    PT Assessment Patient needs continued PT services  PT Problem List Decreased strength;Decreased activity tolerance;Decreased balance;Decreased mobility       PT Treatment Interventions DME instruction;Gait training;Stair training;Functional mobility training;Therapeutic activities;Therapeutic exercise;Balance training;Patient/family education    PT Goals (Current goals can be found in the Care Plan section)  Acute Rehab PT Goals Patient Stated Goal: return home with family to assist PT Goal Formulation: With patient/family Time For Goal Achievement: 07/24/23 Potential to Achieve Goals: Good    Frequency Min 3X/week     Co-evaluation PT/OT/SLP Co-Evaluation/Treatment: Yes Reason for Co-Treatment: To address functional/ADL transfers PT goals addressed during  session: Mobility/safety with mobility;Balance;Proper use of DME         AM-PAC PT "6 Clicks" Mobility  Outcome Measure Help needed turning from your back to your side while in a flat bed without using bedrails?: None Help needed moving from lying on your back to sitting on the side of a flat bed without using bedrails?: None Help needed moving to and from a bed to a chair (including a wheelchair)?: A Little Help needed standing up from a chair using your arms (e.g., wheelchair or bedside chair)?: A Little Help needed to walk in hospital room?: A Lot Help needed climbing 3-5 steps with a railing? : A Lot 6 Click Score: 18    End of Session   Activity Tolerance: Patient tolerated treatment well;Patient limited by fatigue;Other (comment) (limited by nausea and dizziness) Patient left: in bed;with call bell/phone within reach;with family/visitor present Nurse Communication: Mobility status PT Visit Diagnosis: Unsteadiness on feet (R26.81);Other abnormalities of gait and mobility (R26.89);Muscle weakness (generalized) (M62.81)    Time: 6045-4098 PT Time Calculation (min) (ACUTE ONLY): 26 min   Charges:   PT Evaluation $PT Eval Moderate Complexity: 1 Mod PT Treatments $Therapeutic Activity: 23-37 mins PT General Charges $$ ACUTE PT VISIT: 1  Visit         3:51 PM, 07/03/23 Ocie Bob, MPT Physical Therapist with [facility] [phone] office [phone] mobile phone

## 2023-07-03 NOTE — Progress Notes (Signed)
PROGRESS NOTE    [name]  YNW:[id number] DOB: Dec 31, 1945 DOA: 07/02/2023 PCP: Benita Stabile, MD   Brief Narrative:    Dennis Gilbert is a 77 y.o. male with medical history significant of hypertension who presents to the emergency department accompanied by daughter or son-in-law due to abdominal pain and constipation.  Patient was admitted for further evaluation of this and has had a bloody bowel movement for which GI evaluation is pending.  He is also noted to have some dizziness that requires further evaluation.  He continues to have poor appetite and requires IV fluid.  Assessment & Plan:   Principal Problem:   Dizziness of unknown cause Active Problems:   Abdominal pain   Nausea   Constipation   Dehydration   Essential hypertension   GI bleed   Sinus bradycardia   Hyperglycemia  Assessment and Plan:   Dizziness of unknown cause-orthostatic versus symptomatic bradycardia CT head without contrast showed no acute intracranial abnormality Orthostatic vital signs were negative PT/OT will be consulted to rule out/eval and treat vertigo   Abdominal pain and nausea possibly due to constipation Continue IV fentanyl 25 mcg every 2 hours as needed Continue Zofran as needed Continue MiraLAX, Dulcolax when patient resumes oral intake   Dehydration Continue IV hydration   Essential hypertension Continue IV hydralazine 10 mg every 6 hours as needed for SBP > 170   Intermittent bradycardia Continue telemetry monitoring Not on any AV nodal blockade agents Patient presents with intermittent bradycardia with HR ranging from 36 -57 in the first few hours in the ED, currently improved Continue telemetry Family would appreciate cardiology consult and this has been ordered   GI bleed Patient complained of an episode of large amount of blood after attempting bowel movement at home ?  Diverticulosis Last colonoscopy in chart by Dr. Marletta Lor was on 05/18/2020 and showed  diverticulosis of large intestine without perforation or abscess without bleeding.  Polyp of colon, other hemorrhoids. Gastroenterology recommending clear liquid diet for now as well as laxation with plans for outpatient follow-up   Hyperglycemia with no known history of T2DM CBG 196, this may be a reactive process Hemoglobin A1c 5.6%   Elevated creatinine with no known history of CKD Possible AKI, continue IV fluid Creatinine 1.35, no prior labs for comparison Continue to monitor creatinine    DVT prophylaxis: SCDs Code Status: Full Family Communication: Daughter at bedside 12/23 Disposition Plan:  Status is: Observation The patient will require care spanning > 2 midnights and should be moved to inpatient because: Ongoing need for IV fluid   Consultants:  GI Cardiology  Procedures:  None  Antimicrobials:  None   Subjective: Patient seen and evaluated today and overall still does not feel very well, nor has he had a return of his appetite.  He is having some nausea as well as abdominal pain.  No further bloody bowel movements noted.  Objective: Vitals:   07/02/23 2345 07/03/23 0000 07/03/23 0045 07/03/23 0435  BP: (!) 158/72 (!) 163/86 (!) 153/80 133/64  Pulse: 67 62 62 (!) 58  Resp: (!) 21 (!) 8 16 16   Temp:   98.3 F (36.8 C) 98 F (36.7 C)  TempSrc:   Oral Oral  SpO2: 98% 99% 100% 97%  Weight:      Height:        Intake/Output Summary (Last 24 hours) at 07/03/2023 0714 Last data filed at 07/03/2023 0333 Gross per 24 hour  Intake 152.05 ml  Output --  Net 152.05 ml   Filed Weights   07/02/23 1610  Weight: 61.2 kg    Examination:  General exam: Appears calm and comfortable  Respiratory system: Clear to auscultation. Respiratory effort normal. Cardiovascular system: S1 & S2 heard, RRR.  Gastrointestinal system: Abdomen is soft Central nervous system: Alert and awake Extremities: No edema Skin: No significant lesions noted Psychiatry: Flat  affect.    Data Reviewed: I have personally reviewed following labs and imaging studies  CBC: Recent Labs  Lab 07/02/23 1816 07/03/23 0433  WBC 5.6 6.7  NEUTROABS 4.5  --   HGB 13.4 12.7*  HCT 40.1 37.8*  MCV 100.0 101.1*  PLT 151 140*   Basic Metabolic Panel: Recent Labs  Lab 07/02/23 1816 07/03/23 0433  NA 138 136  K 4.3 4.3  CL 100 100  CO2 29 30  GLUCOSE 196* 144*  BUN 16 16  CREATININE 1.35* 1.29*  CALCIUM 9.7 9.2  MG  --  2.4  PHOS  --  3.1   GFR: Estimated Creatinine Clearance: 42.2 mL/min (A) (by C-G formula based on SCr of 1.29 mg/dL (H)). Liver Function Tests: Recent Labs  Lab 07/02/23 1816 07/03/23 0433  AST 29 26  ALT 20 20  ALKPHOS 55 49  BILITOT 1.0 0.8  PROT 7.1 6.5  ALBUMIN 4.1 3.7   Recent Labs  Lab 07/02/23 1816  LIPASE 23   No results for input(s): "AMMONIA" in the last 168 hours. Coagulation Profile: No results for input(s): "INR", "PROTIME" in the last 168 hours. Cardiac Enzymes: No results for input(s): "CKTOTAL", "CKMB", "CKMBINDEX", "TROPONINI" in the last 168 hours. BNP (last 3 results) No results for input(s): "PROBNP" in the last 8760 hours. HbA1C: No results for input(s): "HGBA1C" in the last 72 hours. CBG: Recent Labs  Lab 07/03/23 0055  GLUCAP 130*   Lipid Profile: No results for input(s): "CHOL", "HDL", "LDLCALC", "TRIG", "CHOLHDL", "LDLDIRECT" in the last 72 hours. Thyroid Function Tests: No results for input(s): "TSH", "T4TOTAL", "FREET4", "T3FREE", "THYROIDAB" in the last 72 hours. Anemia Panel: No results for input(s): "VITAMINB12", "FOLATE", "FERRITIN", "TIBC", "IRON", "RETICCTPCT" in the last 72 hours. Sepsis Labs: No results for input(s): "PROCALCITON", "LATICACIDVEN" in the last 168 hours.  No results found for this or any previous visit (from the past 240 hours).       Radiology Studies: CT Head Wo Contrast Result Date: 07/02/2023 CLINICAL DATA:  Syncope, dizziness EXAM: CT HEAD WITHOUT  CONTRAST TECHNIQUE: Contiguous axial images were obtained from the base of the skull through the vertex without intravenous contrast. RADIATION DOSE REDUCTION: This exam was performed according to the departmental dose-optimization program which includes automated exposure control, adjustment of the mA and/or kV according to patient size and/or use of iterative reconstruction technique. COMPARISON:  08/18/2005 FINDINGS: Brain: There is atrophy and chronic small vessel disease changes. No acute intracranial abnormality. Specifically, no hemorrhage, hydrocephalus, mass lesion, acute infarction, or significant intracranial injury. Vascular: No hyperdense vessel or unexpected calcification. Skull: No acute calvarial abnormality. Sinuses/Orbits: No acute findings Other: None IMPRESSION: Atrophy, chronic microvascular disease. No acute intracranial abnormality. Electronically Signed   By: Charlett Nose M.D.   On: 07/02/2023 22:08   CT ABDOMEN PELVIS W CONTRAST Result Date: 07/02/2023 CLINICAL DATA:  Left lower quadrant abdominal pain. EXAM: CT ABDOMEN AND PELVIS WITH CONTRAST TECHNIQUE: Multidetector CT imaging of the abdomen and pelvis was performed using the standard protocol following bolus administration of intravenous contrast. RADIATION DOSE REDUCTION: This exam was performed according to the departmental  dose-optimization program which includes automated exposure control, adjustment of the mA and/or kV according to patient size and/or use of iterative reconstruction technique. CONTRAST:  80mL OMNIPAQUE IOHEXOL 300 MG/ML  SOLN COMPARISON:  None Available. FINDINGS: Lower chest: The visualized lung bases are clear. There is coronary vascular calcification. No intra-abdominal free air or free fluid. Hepatobiliary: The liver is unremarkable. There is mild biliary dilatation, post cholecystectomy. No retained calcified stone noted in the central CBD. Pancreas: Unremarkable. No pancreatic ductal dilatation or  surrounding inflammatory changes. Spleen: Normal in size without focal abnormality. Adrenals/Urinary Tract: The left adrenal gland is unremarkable. There is a 2 cm fatty lesion in the right adrenal gland, likely a myelolipoma. There is no hydronephrosis on either side. There is symmetric enhancement and excretion of contrast by both kidneys. The visualized ureters appear unremarkable. The urinary bladder is minimally distended and grossly unremarkable. Stomach/Bowel: There is large amount of stool throughout the colon. There is no bowel obstruction or active inflammation. The appendix is normal. Vascular/Lymphatic: Moderate aortoiliac atherosclerotic disease. The IVC is unremarkable. No portal venous gas. There is no adenopathy. Reproductive: The prostate and seminal vesicles are grossly unremarkable. No pelvic mass. Other: Small fat containing right inguinal hernia. There is slight haziness of the herniated fat which may represent edema. Clinical correlation recommended to evaluate for possibility of strangulation or incarceration. No bowel herniation. Musculoskeletal: Degenerative changes of the spine and multiple spurring/osteophyte. No acute osseous pathology. IMPRESSION: 1. A fat containing right inguinal hernia with possible strangulation or incarceration. Clinical correlation is recommended. No other acute intra-abdominal or pelvic pathology. 2. Constipation.  No bowel obstruction. Normal appendix. 3.  Aortic Atherosclerosis (ICD10-I70.0). Electronically Signed   By: Elgie Collard M.D.   On: 07/02/2023 19:53        Scheduled Meds:  polyethylene glycol  17 g Oral Daily   Continuous Infusions:  sodium chloride 60 mL/hr at 07/03/23 0333     LOS: 0 days    Time spent: 55 minutes    Tiernan Millikin Hoover Brunette, DO [facility]  If 7PM-7AM, please contact night-coverage [email or web address] 07/03/2023, 7:14 AM

## 2023-07-03 NOTE — TOC Initial Note (Signed)
Transition of Care [facility]) - Initial/Assessment Note    Patient Details  Name: Dennis Gilbert MRN: [id number] Date of Birth: Nov 11, 1945  [facility] CM/SW Contact:    Leitha Bleak, RN Phone Number: 07/03/2023, 10:07 AM  Clinical Narrative:   Patient admitted due to dizziness. PT is recommending SNF. Daughter and GI at the bedside. Patient has GI bleeding and needing Cardiology consult. He is weak, dizzy and nausea. He normally is very independent and walks up to 8 miles a day.  Patient is wanting to get better and discharge home.  They will discuss SNF but rather go home with home health if needed. Patient is in OBS status currently.               Expected Discharge Plan: Home w Home Health Services Barriers to Discharge: Continued Medical Work up   Patient Goals and CMS Choice Patient states their goals for this hospitalization and ongoing recovery are:: to get better and go home. CMS Medicare.gov Compare Post Acute Care list provided to:: Patient Choice offered to / list presented to : Patient [place] ownership interest in [facility].provided to:: Patient    Expected Discharge Plan and Services     Living arrangements for the past 2 months: Single Family Home                   Prior Living Arrangements/Services Living arrangements for the past 2 months: Single Family Home Lives with:: Self Patient language and need for interpreter reviewed:: Yes Do you feel safe going back to the place where you live?: Yes      Need for Family Participation in Patient Care: Yes (Comment) Care giver support system in place?: Yes (comment)   Criminal Activity/Legal Involvement Pertinent to Current Situation/Hospitalization: No - Comment as needed  Activities of Daily Living   ADL Screening (condition at time of admission) Independently performs ADLs?: Yes (appropriate for developmental age) Is the patient deaf or have difficulty hearing?: Yes Does the patient  have difficulty seeing, even when wearing glasses/contacts?: No Does the patient have difficulty concentrating, remembering, or making decisions?: No  Permission Sought/Granted          Permission granted to share info w Relationship: Daughter    Emotional Assessment     Affect (typically observed): Accepting, Apprehensive Orientation: : Oriented to Self, Oriented to Place, Oriented to  Time, Oriented to Situation Alcohol / Substance Use: Not Applicable Psych Involvement: No (comment)  Admission diagnosis:  Bradycardia [R00.1] Vertigo [R42] Hypertensive urgency [I16.0] Dizziness of unknown cause [R42] Constipation, unspecified constipation type [K59.00] Patient Active Problem List   Diagnosis Date Noted   Abdominal pain 07/03/2023   Nausea 07/03/2023   Constipation 07/03/2023   Dehydration 07/03/2023   Essential hypertension 07/03/2023   GI bleed 07/03/2023   Sinus bradycardia 07/03/2023   Hyperglycemia 07/03/2023   Dizziness of unknown cause 07/02/2023   Positive colorectal cancer screening using Cologuard test 05/13/2020   PCP:  Benita Stabile, MD Pharmacy:   [facility] [phone] - [place], [place] - [address]  [place] [address] Phone: [phone] Fax: [phone]   Social Drivers of Health (SDOH) Social History: SDOH Screenings   Food Insecurity: No Food Insecurity (07/03/2023)  Housing: Low Risk  (07/03/2023)  Transportation Needs: No Transportation Needs (07/03/2023)  Utilities: Not At Risk (07/03/2023)  Tobacco Use: Low Risk  (07/02/2023)  SDOH Interventions:

## 2023-07-03 NOTE — Consult Note (Addendum)
Cardiology Consult    Patient ID: Dennis Gilbert MRN: [id number], DOB/AGE: 1946/01/10   Admit date: 07/02/2023 Date of Consult: 07/03/2023  Primary Physician: Benita Stabile, MD Primary Cardiologist: Nona Dell, MD - new Requesting Provider: P. [name], DO  Patient Profile    Dennis Gilbert is a 77 y.o. male with a history of HTN and HL, who is being seen today for the evaluation of PVCs at the request of Dr. [name].  Past Medical History  Subjective  Past Medical History:  Diagnosis Date   Bulging lumbar disc    Chronic kidney disease (CKD)    HTN (hypertension)    Hyperlipidemia     Past Surgical History:  Procedure Laterality Date   BACK SURGERY     CHOLECYSTECTOMY     COLONOSCOPY WITH PROPOFOL N/A 05/18/2020   Procedure: COLONOSCOPY WITH PROPOFOL;  Surgeon: Lanelle Bal, DO;  Location: [facility];  Service: Endoscopy;  Laterality: N/A;  11:45am   NECK SURGERY     POLYPECTOMY  05/18/2020   Procedure: POLYPECTOMY INTESTINAL;  Surgeon: Lanelle Bal, DO;  Location: [facility];  Service: Endoscopy;;     Allergies  No Known Allergies    History of Present Illness   77 year old male with a history of hypertension and hyperlipidemia.  He has no prior cardiac history.  He lives locally and is very active without any prior history of chest pain or dyspnea on exertion.  He was in his usual state of health until about a month ago, when he started to experience constipation with progressive abdominal bloating and discomfort.  On approximately December 20, he had worsening abdominal pain, constipation, anorexia, and orthostatic lightheadedness and dizziness ("room spinning").  On December 22, he noted blood in stool and presented to the emergency department for further evaluation.    Here, he had a low-grade fever on presentation at 99.6.  He was hypertensive at 192/62.  Presenting ECG on December 22 showed probable ectopic atrial rhythm with frequent PVCs.  It is  notable that at the same time, heart rates recorded in the vital signs section of his chart were listed between 36 and 78.  Despite this, follow-up ECG approximately 6 hours later showed sinus rhythm with frequent PVCs and nonspecific ST and T changes.  Lab work notable for creatinine of 1.35 (unclear baseline), and normal H&H at 13.4/40.1.  Stool was guaiac positive.  CT of the abdomen and pelvis showed fat-containing right inguinal hernia with possible strangulation or incarceration and aortic atherosclerosis.  CT of the head showed atrophy and chronic microvascular disease without acute intracranial abnormality.  ECG earlier this morning shows sinus rhythm at 75 with frequent PVCs.  He has been seen by GI with plan for laxatives and outpatient follow-up.  His ex-wife and daughter at the bedside currently.  Patient notes ongoing abdominal discomfort and orthostatic lightheadedness.  Orthostatic vital signs obtained yesterday evening were notable for significant hypertension.  We have been asked to evaluate the patient due to bradycardia recorded in his chart.  He has been on a bedside monitor but is not attached to central telemetry.  On his bedside monitor, he continues to have sinus rhythm with frequent PVCs with rates in the 70s and 80s.  His daughter, who is at the bedside says that she has witnessed his pulse oximeter showing heart rates in the 30s and 40s while simultaneously the bedside monitor showing rates in the 70s and 80s.  Patient denies any palpitations.  Inpatient Medications  Subjective   Scheduled Meds: Continuous Infusions:  sodium chloride 75 mL/hr at 07/03/23 1134   PRN Meds:.bisacodyl, fentaNYL (SUBLIMAZE) injection, hydrALAZINE, ondansetron **OR** ondansetron (ZOFRAN) IV, mouth rinse, traMADol   Family History    Family History  Problem Relation Age of Onset   Lung cancer Mother 45   Heart attack Father 16   Colon cancer Neg Hx    Colon polyps Neg Hx    He indicated that  his mother is deceased. He indicated that his father is deceased. He indicated that the status of his neg hx is unknown.   Social History    Social History   Socioeconomic History   Marital status: Legally Separated    Spouse name: Not on file   Number of children: Not on file   Years of education: Not on file   Highest education level: Not on file  Occupational History   Not on file  Tobacco Use   Smoking status: Never   Smokeless tobacco: Never  Substance and Sexual Activity   Alcohol use: Not Currently    Comment: no history of regular use   Drug use: Not Currently   Sexual activity: Not on file  Other Topics Concern   Not on file  Social History Narrative   Lives locally.  Active but does not routinely exercise.   Social Drivers of Corporate investment banker Strain: Not on file  Food Insecurity: No Food Insecurity (07/03/2023)   Hunger Vital Sign    Worried About Running Out of Food in the Last Year: Never true    Ran Out of Food in the Last Year: Never true  Transportation Needs: No Transportation Needs (07/03/2023)   PRAPARE - Administrator, Civil Service (Medical): No    Lack of Transportation (Non-Medical): No  Physical Activity: Not on file  Stress: Not on file  Social Connections: Not on file  Intimate Partner Violence: Not At Risk (07/03/2023)   Humiliation, Afraid, Rape, and Kick questionnaire    Fear of Current or Ex-Partner: No    Emotionally Abused: No    Physically Abused: No    Sexually Abused: No     Review of Systems    General:  No chills, fever, night sweats or weight changes.  Cardiovascular:  +++ Orthostatic lightheadedness.  No chest pain, dyspnea on exertion, edema, orthopnea, palpitations, paroxysmal nocturnal dyspnea. Dermatological: No rash, lesions/masses Respiratory: No cough, dyspnea Urologic: No hematuria, dysuria Abdominal:   +++ nausea, +++ constipation, +++ abdominal discomfort and bloating, 1 episode of bright  red blood per rectum, no vomiting, diarrhea, melena, or hematemesis Neurologic:  No visual changes, wkns, changes in mental status. All other systems reviewed and are otherwise negative except as noted above.    Objective  Physical Exam    Blood pressure (!) 161/82, pulse 61, temperature 98.6 F (37 C), temperature source Oral, resp. rate 17, height 5\' 6"  (1.676 m), weight 61.2 kg, SpO2 98%.  General: Pleasant, NAD Psych: Normal affect. Neuro: Alert and oriented X 3. Moves all extremities spontaneously. HEENT: Normal  Neck: Supple without bruits or JVD. Lungs:  Resp regular and unlabored, CTA. Heart: Irregular, frequent ectopy, no s3, s4, or murmurs. Abdomen: Soft, diffusely tender to light touch, non-distended, BS + x 4.  Extremities: No clubbing, cyanosis or edema. DP/PT2+, Radials 2+ and equal bilaterally.  Labs     Lab Results  Component Value Date   WBC 6.7 07/03/2023   HGB 12.7 (L)  07/03/2023   HCT 37.8 (L) 07/03/2023   MCV 101.1 (H) 07/03/2023   PLT 140 (L) 07/03/2023    Recent Labs  Lab 07/03/23 0433  NA 136  K 4.3  CL 100  CO2 30  BUN 16  CREATININE 1.29*  CALCIUM 9.2  PROT 6.5  BILITOT 0.8  ALKPHOS 49  ALT 20  AST 26  GLUCOSE 144*    Radiology Studies    CT Head Wo Contrast Result Date: 07/02/2023 CLINICAL DATA:  Syncope, dizziness EXAM: CT HEAD WITHOUT CONTRAST TECHNIQUE: Contiguous axial images were obtained from the base of the skull through the vertex without intravenous contrast. RADIATION DOSE REDUCTION: This exam was performed according to the departmental dose-optimization program which includes automated exposure control, adjustment of the mA and/or kV according to patient size and/or use of iterative reconstruction technique. COMPARISON:  08/18/2005 FINDINGS: Brain: There is atrophy and chronic small vessel disease changes. No acute intracranial abnormality. Specifically, no hemorrhage, hydrocephalus, mass lesion, acute infarction, or significant  intracranial injury. Vascular: No hyperdense vessel or unexpected calcification. Skull: No acute calvarial abnormality. Sinuses/Orbits: No acute findings Other: None IMPRESSION: Atrophy, chronic microvascular disease. No acute intracranial abnormality. Electronically Signed   By: Charlett Nose M.D.   On: 07/02/2023 22:08   CT ABDOMEN PELVIS W CONTRAST Result Date: 07/02/2023 CLINICAL DATA:  Left lower quadrant abdominal pain. EXAM: CT ABDOMEN AND PELVIS WITH CONTRAST TECHNIQUE: Multidetector CT imaging of the abdomen and pelvis was performed using the standard protocol following bolus administration of intravenous contrast. RADIATION DOSE REDUCTION: This exam was performed according to the departmental dose-optimization program which includes automated exposure control, adjustment of the mA and/or kV according to patient size and/or use of iterative reconstruction technique. CONTRAST:  80mL OMNIPAQUE IOHEXOL 300 MG/ML  SOLN COMPARISON:  None Available. FINDINGS: Lower chest: The visualized lung bases are clear. There is coronary vascular calcification. No intra-abdominal free air or free fluid. Hepatobiliary: The liver is unremarkable. There is mild biliary dilatation, post cholecystectomy. No retained calcified stone noted in the central CBD. Pancreas: Unremarkable. No pancreatic ductal dilatation or surrounding inflammatory changes. Spleen: Normal in size without focal abnormality. Adrenals/Urinary Tract: The left adrenal gland is unremarkable. There is a 2 cm fatty lesion in the right adrenal gland, likely a myelolipoma. There is no hydronephrosis on either side. There is symmetric enhancement and excretion of contrast by both kidneys. The visualized ureters appear unremarkable. The urinary bladder is minimally distended and grossly unremarkable. Stomach/Bowel: There is large amount of stool throughout the colon. There is no bowel obstruction or active inflammation. The appendix is normal. Vascular/Lymphatic:  Moderate aortoiliac atherosclerotic disease. The IVC is unremarkable. No portal venous gas. There is no adenopathy. Reproductive: The prostate and seminal vesicles are grossly unremarkable. No pelvic mass. Other: Small fat containing right inguinal hernia. There is slight haziness of the herniated fat which may represent edema. Clinical correlation recommended to evaluate for possibility of strangulation or incarceration. No bowel herniation. Musculoskeletal: Degenerative changes of the spine and multiple spurring/osteophyte. No acute osseous pathology. IMPRESSION: 1. A fat containing right inguinal hernia with possible strangulation or incarceration. Clinical correlation is recommended. No other acute intra-abdominal or pelvic pathology. 2. Constipation.  No bowel obstruction. Normal appendix. 3.  Aortic Atherosclerosis (ICD10-I70.0). Electronically Signed   By: Elgie Collard M.D.   On: 07/02/2023 19:53      ECG & Cardiac Imaging    12/22 16:16 - probable ectopic atrial rhythm with frequent PVCs - personally reviewed   12/22  23:33 - sinus rhythm, 84, frequent PVCs and nonspecific ST and T changes - personally reviewed 12/23 10:38 -regular sinus rhythm, 75, frequent PVCs, nonspecific ST and T changes-personally reviewed  Assessment & Plan    1.  PVCs: We have been asked to evaluate the patient due to recordings of bradycardia and the vital signs section of his chart.  He has not been on central telemetry and therefore, I am unable to review all of his heart rhythms, though 3 twelve-lead ECGs and current bedside monitoring shows sinus rhythm with rates in the 70s to 80s, and frequent PVCs.  I suspect, that the recorded heart rates are based off of either pulse oximetry or a blood pressure cuff, and are likely inaccurate in the setting of frequent PVCs w/ poor distal perfusion.  His daughter, who is at the bedside reports that she has witnessed low heart rates on peripheral monitoring with heart rates in  the 70s and 80s on his bedside monitor.  He does have frequent PVCs which is of potential concern.  He denies any prior history of chest pain or dyspnea.  His electrolytes are normal.  I will obtain a TSH and echocardiogram.  In light of frequent PVCs, he would benefit from placement of a 3-day ZIO monitor to determine PVC burden and understand if further evaluation is warranted.  I will place an order for central telemetry and add beta-blocker therapy for PVCs.  2.  Constipation: Seen by GI with plan for laxatives and outpatient follow-up.  3.  Bright red blood per rectum: 1 episode prior to admission.  Guaiac for stool was positive.  H&H has been normal without recurrent bleeding.  Per GI.  4.  Hypertension: Was on losartan 100 mg daily at home.  This was not continued on admission presumably in the setting of a creatinine of 1.35 with concern for dehydration.  Creatinine 1.29 this morning.  Recommend resuming losartan.  Will also add carvedilol in the setting of frequent PVCs.  5. ? chronic kidney disease: As above, creatinine 1.35 on admission and 1.29 this morning.  Was on losartan as an outpatient.  Follow-up.  6.  Orthostatic lightheadedness: Ongoing since 2 days prior to presentation.  Orthostatic vital signs were negative and only notable for sustained pressures in the 180s and 190s.  Heart rate recordings during vital signs were likely inaccurate as rates in the 40s and 50s were recorded from blood pressure machine while the twelve-lead ECG in a similar timeframe showed sinus rhythm at 84 with frequent PVCs.  It does not appear that PVCs are causing orthostatic hypotension.  Head CT was unremarkable.  I did not appreciate carotid bruits on examination.  Consider ultrasound.  Risk Assessment/Risk Scores:      Signed, Nicolasa Ducking, NP 07/03/2023, 12:40 PM  For questions or updates, please contact   Please consult [email or web address] for contact info under Cardiology/STEMI.    Attending  note:  Patient seen and examined.  I reviewed available records and discussed the case with Mr. Brion Aliment NP, agree with his above findings and discussion.  Cardiology consulted due to concern for intermittent bradycardia as discussed above, this looks to be most likely secondary to "pseudo-bradycardia" in the setting of frequent PVCs and under counted heart rate based on automatic blood pressure cuff and pulse oximetry.  None of his ECGs and present telemetry show true bradycardia, although he does have very frequent PVCs with uncertain baseline.  Patient presents to the hospital reporting constipation within the last 2  weeks culminating in abdominal pain and apparent hematochezia.  He took a variety of laxatives at home without benefit.  He is noted to be guaiac positive although presenting hemoglobin was normal.  He has been hypertensive with no evidence of orthostatic hypotension. At baseline he reports no history of exertional chest pain or limiting shortness of breath, no palpitations or sudden syncope.  Describes himself as typically very active.    On examination he is currently afebrile, heart rate in the 70s in sinus rhythm with frequent PVCs by available telemetry.  Blood pressure 161/82.  Lungs are clear.  Cardiac exam with RRR and frequent ectopy.  Abdomen mildly tender.  No peripheral edema.  Pertinent lab work includes potassium 4.3, BUN 16, creatinine 1.29 with GFR 57, hemoglobin 12.7, platelets 140.  ECG shows sinus rhythm with ventricular bigeminy, nonspecific ST changes.  Abdominal and pelvic CT indicates a right inguinal hernia with possible strangulation/incarceration but no other acute intra-abdominal process.  Plan is to place patient on standard telemetry and observe rhythm more carefully.  He does have frequent PVCs, so would obtain an echocardiogram to assess LVEF, also screening TSH.  Starting empiric beta-blocker.  Can resume Cozaar for treatment of primary hypertension, he had been  on this as an outpatient.  Further workup of abdominal symptoms and abnormal CT findings per primary team.  Jonelle Sidle, M.D., F.A.C.C.

## 2023-07-03 NOTE — Progress Notes (Signed)
Patient arrived to room. VSS. Patient is feeling dizzy and nauseous.   Clammy. Blood sugar checked-130. Completed admission question with daughter [name]. [name] took patient's wallet home. Patient resting in bed with bed alarm on and call bell within reach.

## 2023-07-03 NOTE — Telephone Encounter (Signed)
Patient needs hospital follow up in 2-3 weeks with Dr. Marletta Lor or Verlon Au. Dx: constipation, rectal bleeding

## 2023-07-03 NOTE — Progress Notes (Signed)
*  PRELIMINARY RESULTS* Echocardiogram 2D Echocardiogram has been performed.  Stacey Drain 07/03/2023, 1:55 PM

## 2023-07-03 NOTE — Plan of Care (Signed)
  Problem: Acute Rehab OT Goals (only OT should resolve) Goal: Pt. Will Perform Grooming Flowsheets (Taken 07/03/2023 1159) Pt Will Perform Grooming:  with modified independence  standing Goal: Pt. Will Transfer To Toilet Flowsheets (Taken 07/03/2023 1159) Pt Will Transfer to Toilet:  with modified independence  ambulating Goal: Pt. Will Perform Toileting-Clothing Manipulation Flowsheets (Taken 07/03/2023 1159) Pt Will Perform Toileting - Clothing Manipulation and hygiene: with modified independence  Dellia Donnelly OT, MOT

## 2023-07-03 NOTE — Consult Note (Cosign Needed)
Gastroenterology Consult   Referring Provider: No ref. provider found Primary Care Physician:  Benita Stabile, MD Primary Gastroenterologist:  Hennie Duos. Marletta Lor, DO  Patient ID: Dennis Gilbert; [id number]; 04-06-1946   Admit date: 07/02/2023  LOS: 0 days   Date of Consultation: 07/03/2023  Reason for Consultation:  GI bleed, rule out diverticulosis    History of Present Illness   Dennis Gilbert is a 77 y.o. male with PMH significant for CKD, HTN, HLD presenting to ED with abdominal pain, nausea, constipation, blood in the stool for which GI has been consulted. Patient admitted due to dizziness and bradycardia.   In ED: BP 192/62, HR 44, T 99.6. HR as low as 36 in the ED. WBC 5.6, Hgb 13.4, Platelet 151, glucose 196, Cre 1.35, LFTs normal. Hgb down to 12.7 today. Cre down to 1.29.   CT A/P with contrast in ED: fat containing right inguinal hernia with possible strangulation or incarceration, clinical correlation recommended. Constipation, no bowel obstructions. Normal appendix.   CT Head without contrast: no acute intracranial abnormality  Rectal exam with pink tinged mucoid residue, heme positive. No rectal impaction.  GI consult: reports increasing constipation over the past one month, using stool softeners, MOM, bisacodyl, Miralax but over the past one week has not had a BM. History obtained from both patient and daughter. Denies change in diet or medications. Typically very active until the past couple of weeks. He believes he has lost some weight, typically weighing about 135 pounds. He says he has tried everything lately to have a BM. Sounds like he has not taken Miralax consistently therefore has not been effective. He had similar issue with constipation several years ago in setting of iron and symptoms resolved with using benefiber daily. He reports some associated abdominal pain since having issues with constipation. Pain is more left sided. He denies pain associated with right  inguinal hernia. He has had associated nausea, diminished oral intake due to constipation. He has been afraid to eat. Day of presentation he gave himself an enema, denies any issues with administering enema. After, he expelled enema water with large amount of red blood but no stool. Patient also reports new symptoms over the past several days, gradually worsening. He feels dizzy, clammy, lightheaded when trying to stand, feels better when he lays back down. Has had some heartburn when laying down more recently. No dysphagia, melena.  Plans were for outpatient management but due to significant dizziness and unsteadiness on his feet with standing, patient was admitted for further work up. Also noted bradycardia with elevation of BP, no signs of orthostasis to explain his dizziness.   Colonoscopy 05/2020: -Preparation of colon was fair -nonbleeding internal hemorrhoids -diverticulosis -one 5mm polyp in transverse colon, sessile serrated polyp -repeat colonoscopy five years   Prior to Admission medications   Medication Sig Start Date End Date Taking? Authorizing Provider  polyethylene glycol (MIRALAX) 17 g packet Take 17 g by mouth in the morning, at noon, and at bedtime. Continue using this medicine until your stools have become loose - then may descrease dosing until you have a normal stool without constipation 07/02/23  Yes [name], [name], PA-C  hydrochlorothiazide (HYDRODIURIL) 12.5 MG tablet Take 12.5 mg by mouth daily. 05/13/20   [provider]  losartan (COZAAR) 100 MG tablet Take 100 mg by mouth daily. 03/23/20   [provider]  traMADol (ULTRAM) 50 MG tablet Take 50 mg by mouth daily as needed (Back pain).  [provider]    Current Facility-Administered Medications  Medication Dose Route Frequency Provider Last Rate Last Admin   0.9 %  sodium chloride infusion   Intravenous Continuous Adefeso, Oladapo, DO 60 mL/hr at 07/03/23 0333 Infusion Verify at 07/03/23  0333   bisacodyl (DULCOLAX) EC tablet 5 mg  5 mg Oral Daily PRN Adefeso, Oladapo, DO       fentaNYL (SUBLIMAZE) injection 25 mcg  25 mcg Intravenous Q2H PRN Adefeso, Oladapo, DO       hydrALAZINE (APRESOLINE) injection 10 mg  10 mg Intravenous Q6H PRN Adefeso, Oladapo, DO       ondansetron (ZOFRAN) tablet 4 mg  4 mg Oral Q6H PRN Adefeso, Oladapo, DO       Or   ondansetron (ZOFRAN) injection 4 mg  4 mg Intravenous Q6H PRN Adefeso, Oladapo, DO       polyethylene glycol (MIRALAX / GLYCOLAX) packet 17 g  17 g Oral Daily Adefeso, Oladapo, DO        Allergies as of 07/02/2023   (No Known Allergies)    Past Medical History:  Diagnosis Date   Bulging lumbar disc    Chronic kidney disease (CKD)    HTN (hypertension)    Hyperlipidemia     Past Surgical History:  Procedure Laterality Date   BACK SURGERY     CHOLECYSTECTOMY     COLONOSCOPY WITH PROPOFOL N/A 05/18/2020   Procedure: COLONOSCOPY WITH PROPOFOL;  Surgeon: Lanelle Bal, DO;  Location: [facility];  Service: Endoscopy;  Laterality: N/A;  11:45am   NECK SURGERY     POLYPECTOMY  05/18/2020   Procedure: POLYPECTOMY INTESTINAL;  Surgeon: Lanelle Bal, DO;  Location: [facility];  Service: Endoscopy;;    Family History  Problem Relation Age of Onset   Lung cancer Mother 25   Heart attack Father 54   Colon cancer Neg Hx    Colon polyps Neg Hx     Social History   Socioeconomic History   Marital status: Legally Separated    Spouse name: Not on file   Number of children: Not on file   Years of education: Not on file   Highest education level: Not on file  Occupational History   Not on file  Tobacco Use   Smoking status: Never   Smokeless tobacco: Never  Substance and Sexual Activity   Alcohol use: Not Currently    Comment: no history of regular use   Drug use: Not Currently   Sexual activity: Not on file  Other Topics Concern   Not on file  Social History Narrative   Not on file   Social Drivers of  Health   Financial Resource Strain: Not on file  Food Insecurity: No Food Insecurity (07/03/2023)   Hunger Vital Sign    Worried About Running Out of Food in the Last Year: Never true    Ran Out of Food in the Last Year: Never true  Transportation Needs: No Transportation Needs (07/03/2023)   PRAPARE - Administrator, Civil Service (Medical): No    Lack of Transportation (Non-Medical): No  Physical Activity: Not on file  Stress: Not on file  Social Connections: Not on file  Intimate Partner Violence: Not At Risk (07/03/2023)   Humiliation, Afraid, Rape, and Kick questionnaire    Fear of Current or Ex-Partner: No    Emotionally Abused: No    Physically Abused: No    Sexually Abused: No  Review of System:   General: Negative for  fever, chills, fatigue, +acute onset weakness.see hpi Eyes: Negative for vision changes.  ENT: Negative for hoarseness, difficulty swallowing , nasal congestion. CV: Negative for chest pain, angina, palpitations, dyspnea on exertion, peripheral edema.  Respiratory: Negative for dyspnea at rest, dyspnea on exertion, cough, sputum, wheezing.  GI: See history of present illness. GU:  Negative for dysuria, hematuria, urinary incontinence, urinary frequency, nocturnal urination.  MS: Negative for joint pain, low back pain.  Derm: Negative for rash or itching.  Neuro: Negative for weakness, abnormal sensation, seizure, frequent headaches, memory loss, confusion.  Psych: Negative for anxiety, depression, suicidal ideation, hallucinations.  Endo: Negative for unusual weight change.  Heme: Negative for bruising or bleeding. Allergy: Negative for rash or hives.      Physical Examination:   Vital signs in last 24 hours: Temp:  [98 F (36.7 C)-99.6 F (37.6 C)] 98 F (36.7 C) (12/23 0435) Pulse Rate:  [36-68] 58 (12/23 0435) Resp:  [8-33] 16 (12/23 0435) BP: (133-201)/(59-94) 133/64 (12/23 0435) SpO2:  [96 %-100 %] 97 % (12/23 0435) Weight:   [61.2 kg] 61.2 kg (12/22 1610) Last BM Date : 06/26/23 (patient stated over a week ago)  General: Well-nourished, well-developed in no acute distress.  Head: Normocephalic, atraumatic.   Eyes: Conjunctiva pink, no icterus. Mouth: Oropharyngeal mucosa moist and pink   Neck: Supple without thyromegaly, masses, or lymphadenopathy.  Lungs: Clear to auscultation bilaterally.  Heart: Regular rate and rhythm, no murmurs rubs or gallops.  Abdomen: Bowel sounds are normal, nontender, nondistended, no hepatosplenomegaly or masses, no abdominal bruits, no rebound or guarding.   Rectal: not performed Extremities: No lower extremity edema, clubbing, deformity.  Neuro: Alert and oriented x 4 , grossly normal neurologically.  Skin: Warm and dry, no rash or jaundice.   Psych: Alert and cooperative, normal mood and affect.        Intake/Output from previous day: 12/22 0701 - 12/23 0700 In: 152.1 [I.V.:152.1] Out: -  Intake/Output this shift: No intake/output data recorded.  Lab Results:   CBC Recent Labs    07/02/23 1816 07/03/23 0433  WBC 5.6 6.7  HGB 13.4 12.7*  HCT 40.1 37.8*  MCV 100.0 101.1*  PLT 151 140*   BMET Recent Labs    07/02/23 1816 07/03/23 0433  NA 138 136  K 4.3 4.3  CL 100 100  CO2 29 30  GLUCOSE 196* 144*  BUN 16 16  CREATININE 1.35* 1.29*  CALCIUM 9.7 9.2   LFT Recent Labs    07/02/23 1816 07/03/23 0433  BILITOT 1.0 0.8  ALKPHOS 55 49  AST 29 26  ALT 20 20  PROT 7.1 6.5  ALBUMIN 4.1 3.7    Lipase Recent Labs    07/02/23 1816  LIPASE 23    PT/INR No results for input(s): "LABPROT", "INR" in the last 72 hours.   Hepatitis Panel No results for input(s): "HEPBSAG", "HCVAB", "HEPAIGM", "HEPBIGM" in the last 72 hours.   Imaging Studies:   CT Head Wo Contrast Result Date: 07/02/2023 CLINICAL DATA:  Syncope, dizziness EXAM: CT HEAD WITHOUT CONTRAST TECHNIQUE: Contiguous axial images were obtained from the base of the skull through the vertex  without intravenous contrast. RADIATION DOSE REDUCTION: This exam was performed according to the departmental dose-optimization program which includes automated exposure control, adjustment of the mA and/or kV according to patient size and/or use of iterative reconstruction technique. COMPARISON:  08/18/2005 FINDINGS: Brain: There is atrophy and chronic small vessel  disease changes. No acute intracranial abnormality. Specifically, no hemorrhage, hydrocephalus, mass lesion, acute infarction, or significant intracranial injury. Vascular: No hyperdense vessel or unexpected calcification. Skull: No acute calvarial abnormality. Sinuses/Orbits: No acute findings Other: None IMPRESSION: Atrophy, chronic microvascular disease. No acute intracranial abnormality. Electronically Signed   By: Charlett Nose M.D.   On: 07/02/2023 22:08   CT ABDOMEN PELVIS W CONTRAST Result Date: 07/02/2023 CLINICAL DATA:  Left lower quadrant abdominal pain. EXAM: CT ABDOMEN AND PELVIS WITH CONTRAST TECHNIQUE: Multidetector CT imaging of the abdomen and pelvis was performed using the standard protocol following bolus administration of intravenous contrast. RADIATION DOSE REDUCTION: This exam was performed according to the departmental dose-optimization program which includes automated exposure control, adjustment of the mA and/or kV according to patient size and/or use of iterative reconstruction technique. CONTRAST:  80mL OMNIPAQUE IOHEXOL 300 MG/ML  SOLN COMPARISON:  None Available. FINDINGS: Lower chest: The visualized lung bases are clear. There is coronary vascular calcification. No intra-abdominal free air or free fluid. Hepatobiliary: The liver is unremarkable. There is mild biliary dilatation, post cholecystectomy. No retained calcified stone noted in the central CBD. Pancreas: Unremarkable. No pancreatic ductal dilatation or surrounding inflammatory changes. Spleen: Normal in size without focal abnormality. Adrenals/Urinary Tract: The  left adrenal gland is unremarkable. There is a 2 cm fatty lesion in the right adrenal gland, likely a myelolipoma. There is no hydronephrosis on either side. There is symmetric enhancement and excretion of contrast by both kidneys. The visualized ureters appear unremarkable. The urinary bladder is minimally distended and grossly unremarkable. Stomach/Bowel: There is large amount of stool throughout the colon. There is no bowel obstruction or active inflammation. The appendix is normal. Vascular/Lymphatic: Moderate aortoiliac atherosclerotic disease. The IVC is unremarkable. No portal venous gas. There is no adenopathy. Reproductive: The prostate and seminal vesicles are grossly unremarkable. No pelvic mass. Other: Small fat containing right inguinal hernia. There is slight haziness of the herniated fat which may represent edema. Clinical correlation recommended to evaluate for possibility of strangulation or incarceration. No bowel herniation. Musculoskeletal: Degenerative changes of the spine and multiple spurring/osteophyte. No acute osseous pathology. IMPRESSION: 1. A fat containing right inguinal hernia with possible strangulation or incarceration. Clinical correlation is recommended. No other acute intra-abdominal or pelvic pathology. 2. Constipation.  No bowel obstruction. Normal appendix. 3.  Aortic Atherosclerosis (ICD10-I70.0). Electronically Signed   By: Elgie Collard M.D.   On: 07/02/2023 19:53  [4 week]  Assessment:   77 y/o male with HTN, HLD, CKD presenting to the ED with abdominal pain, nausea, constipation, blood in the stool after giving himself an enema at home. While in the ED he was noted to have significant bradycardia and dizziness without orthostasis. Patient reports getting clammy and hot with standing. He was admitted for further work up. GI consulted due to GI bleeding.   Constipation/LLQ pain/rectal bleeding: one month of worsenign constipation. Noted blood in the stool after  enema at home. States he has not had a BM in one week. DRE with blood tinged mucous, heme positive. Hbg normal on admission with some decline over night likely hemodilution. Last colonosocpy in 2001, internal hemrorhoids, diverticulosis, and single adenoma removed. I suspect benign self limiting rectal bleeding, last bleeding while at home. Needs good bowel regimen to provide relief today, has received one dose of miralax since admission. At home he will need daily bowel regimen. Monitor for further bleeding.   Right inguinal hernia: question of strangulation or incarceration on CT due to slight haziness  of the herniated fat which could represent edema. Patient denies associated pain.  Dizziness/bradycardia: patient reporting recent onset symptoms. Feels he cannot stand as he gets hot/clammy, dizzy, and nauseated. He denies room spinning, etc. ?cardiac etiology.   Plan:   Clear liquid diet.  Continue to monitor for recurrent rectal bleeding, or significant decline in hgb.  Bisacodyl suppository. Golytely today, I have encouraged him to drink 8 ounces every 20-30 minutes. He will not need to consume entire gallon, but have encouraged he drink enough to have multiple soft/loose stools.  At discharge, he should take benefiber 2 teaspoons daily for 5 days, then increase to BID.  At discharge, he should take Miralax 17 grams daily. If his stools become to frequent or loose, then he can take daily as needed.  We will arrange for outpatient follow up in 2-3 weeks.    LOS: 0 days   We would like to thank you for the opportunity to participate in the care of SHAFER GUERNSEY.  Leanna Battles. Dixon Boos [facility] [phone] 12/23/20247:09 AM

## 2023-07-03 NOTE — ED Notes (Signed)
ED TO INPATIENT HANDOFF REPORT  ED Nurse Name and Phone #: Karoline Caldwell [phone]   S Name/Age/Gender Norval Gable 77 y.o. male Room/Bed: APA07/APA07  Code Status   Code Status: Not on file  Home/SNF/Other Home Patient oriented to: self, place, time, and situation Is this baseline? Yes   Triage Complete: Triage complete  Chief Complaint Dizziness of unknown cause [R42]  Triage Note Pt with recent constipation and taking stool softeners for the past month, took an enema and passed blood in stools today. Denies any blood thinners. Dizziness x 2 days.    Allergies No Known Allergies  Level of Care/Admitting Diagnosis ED Disposition     ED Disposition  Admit   Condition  --   Comment  Hospital Area: [facility] [[id number]]  Level of Care: Telemetry [5]  Covid Evaluation: Asymptomatic - no recent exposure (last 10 days) testing not required  Diagnosis: Dizziness of unknown cause [[id number]]  Admitting Physician: Frankey Shown [[id number]]  Attending Physician: Frankey Shown [[id number]]          B Medical/Surgery History Past Medical History:  Diagnosis Date   Bulging lumbar disc    Chronic kidney disease (CKD)    HTN (hypertension)    Hyperlipidemia    Past Surgical History:  Procedure Laterality Date   BACK SURGERY     CHOLECYSTECTOMY     COLONOSCOPY WITH PROPOFOL N/A 05/18/2020   Procedure: COLONOSCOPY WITH PROPOFOL;  Surgeon: Lanelle Bal, DO;  [facility];  Service: Endoscopy;  Laterality: N/A;  11:45am   NECK SURGERY     POLYPECTOMY  05/18/2020   Procedure: POLYPECTOMY INTESTINAL;  Surgeon: Lanelle Bal, DO;  Location: [facility];  Service: Endoscopy;;     A IV Location/Drains/Wounds Patient Lines/Drains/Airways Status     Active Line/Drains/Airways     Name Placement date Placement time Site Days   Peripheral IV 07/02/23 22 G Posterior;Right Forearm 07/02/23  2259  Forearm  1            Intake/Output Last 24  hours No intake or output data in the 24 hours ending 07/03/23 0015  Labs/Imaging Results for orders placed or performed during the hospital encounter of 07/02/23 (from the past 48 hours)  Sample to Blood Bank     Status: None   Collection Time: 07/02/23  5:54 PM  Result Value Ref Range   Blood Bank Specimen SAMPLE AVAILABLE FOR TESTING    Sample Expiration      07/03/2023,2359 Performed at [facility], [address], [place], [place] [address]   CBC with Differential     Status: Abnormal   Collection Time: 07/02/23  6:16 PM  Result Value Ref Range   WBC 5.6 4.0 - 10.5 K/uL   RBC 4.01 (L) 4.22 - 5.81 MIL/uL   Hemoglobin 13.4 13.0 - 17.0 g/dL   HCT 01.0 93.2 - 35.5 %   MCV 100.0 80.0 - 100.0 fL   MCH 33.4 26.0 - 34.0 pg   MCHC 33.4 30.0 - 36.0 g/dL   RDW 73.2 20.2 - 54.2 %   Platelets 151 150 - 400 K/uL   nRBC 0.0 0.0 - 0.2 %   Neutrophils Relative % 80 %   Neutro Abs 4.5 1.7 - 7.7 K/uL   Lymphocytes Relative 11 %   Lymphs Abs 0.6 (L) 0.7 - 4.0 K/uL   Monocytes Relative 8 %   Monocytes Absolute 0.4 0.1 - 1.0 K/uL   Eosinophils Relative 0 %  Eosinophils Absolute 0.0 0.0 - 0.5 K/uL   Basophils Relative 1 %   Basophils Absolute 0.0 0.0 - 0.1 K/uL   Immature Granulocytes 0 %   Abs Immature Granulocytes 0.01 0.00 - 0.07 K/uL    Comment: Performed at [facility], [address], [place], [place] [address]  Comprehensive metabolic panel     Status: Abnormal   Collection Time: 07/02/23  6:16 PM  Result Value Ref Range   Sodium 138 135 - 145 mmol/L   Potassium 4.3 3.5 - 5.1 mmol/L   Chloride 100 98 - 111 mmol/L   CO2 29 22 - 32 mmol/L   Glucose, Bld 196 (H) 70 - 99 mg/dL    Comment: Glucose reference range applies only to samples taken after fasting for at least 8 hours.   BUN 16 8 - 23 mg/dL   Creatinine, Ser 6.04 (H) 0.61 - 1.24 mg/dL   Calcium 9.7 8.9 - 54.0 mg/dL   Total Protein 7.1 6.5 - 8.1 g/dL   Albumin 4.1 3.5 - 5.0 g/dL   AST 29 15 - 41 U/L   ALT 20 0 - 44 U/L    Alkaline Phosphatase 55 38 - 126 U/L   Total Bilirubin 1.0 <1.2 mg/dL   GFR, Estimated 54 (L) >60 mL/min    Comment: (NOTE) Calculated using the CKD-EPI Creatinine Equation (2021)    Anion gap 9 5 - 15    Comment: Performed at [facility], [address], [place], [place] [address]  Lipase, blood     Status: None   Collection Time: 07/02/23  6:16 PM  Result Value Ref Range   Lipase 23 11 - 51 U/L    Comment: Performed at [facility], [address] [place], [place] [address]  Urinalysis, Routine w reflex microscopic -Urine, Clean Catch     Status: Abnormal   Collection Time: 07/02/23  6:34 PM  Result Value Ref Range   Color, Urine YELLOW YELLOW   APPearance CLEAR CLEAR   Specific Gravity, Urine 1.015 1.005 - 1.030   pH 6.0 5.0 - 8.0   Glucose, UA 50 (A) NEGATIVE mg/dL   Hgb urine dipstick NEGATIVE NEGATIVE   Bilirubin Urine NEGATIVE NEGATIVE   Ketones, ur 5 (A) NEGATIVE mg/dL   Protein, ur 30 (A) NEGATIVE mg/dL   Nitrite NEGATIVE NEGATIVE   Leukocytes,Ua NEGATIVE NEGATIVE   RBC / HPF 0-5 0 - 5 RBC/hpf   WBC, UA 0-5 0 - 5 WBC/hpf   Bacteria, UA NONE SEEN NONE SEEN   Squamous Epithelial / HPF 0-5 0 - 5 /HPF   Mucus PRESENT     Comment: Performed at [facility], [address] [place], [place] [address]  POC occult blood, ED     Status: Abnormal   Collection Time: 07/02/23  7:17 PM  Result Value Ref Range   Fecal Occult Blood Positive (A)    CT Head Wo Contrast Result Date: 07/02/2023 CLINICAL DATA:  Syncope, dizziness EXAM: CT HEAD WITHOUT CONTRAST TECHNIQUE: Contiguous axial images were obtained from the base of the skull through the vertex without intravenous contrast. RADIATION DOSE REDUCTION: This exam was performed according to the departmental dose-optimization program which includes automated exposure control, adjustment of the mA and/or kV according to patient size and/or use of iterative reconstruction technique. COMPARISON:  08/18/2005 FINDINGS: Brain: There is  atrophy and chronic small vessel disease changes. No acute intracranial abnormality. Specifically, no hemorrhage, hydrocephalus, mass lesion, acute infarction, or significant intracranial injury. Vascular: No hyperdense vessel  or unexpected calcification. Skull: No acute calvarial abnormality. Sinuses/Orbits: No acute findings Other: None IMPRESSION: Atrophy, chronic microvascular disease. No acute intracranial abnormality. Electronically Signed   By: Charlett Nose M.D.   On: 07/02/2023 22:08   CT ABDOMEN PELVIS W CONTRAST Result Date: 07/02/2023 CLINICAL DATA:  Left lower quadrant abdominal pain. EXAM: CT ABDOMEN AND PELVIS WITH CONTRAST TECHNIQUE: Multidetector CT imaging of the abdomen and pelvis was performed using the standard protocol following bolus administration of intravenous contrast. RADIATION DOSE REDUCTION: This exam was performed according to the departmental dose-optimization program which includes automated exposure control, adjustment of the mA and/or kV according to patient size and/or use of iterative reconstruction technique. CONTRAST:  80mL OMNIPAQUE IOHEXOL 300 MG/ML  SOLN COMPARISON:  None Available. FINDINGS: Lower chest: The visualized lung bases are clear. There is coronary vascular calcification. No intra-abdominal free air or free fluid. Hepatobiliary: The liver is unremarkable. There is mild biliary dilatation, post cholecystectomy. No retained calcified stone noted in the central CBD. Pancreas: Unremarkable. No pancreatic ductal dilatation or surrounding inflammatory changes. Spleen: Normal in size without focal abnormality. Adrenals/Urinary Tract: The left adrenal gland is unremarkable. There is a 2 cm fatty lesion in the right adrenal gland, likely a myelolipoma. There is no hydronephrosis on either side. There is symmetric enhancement and excretion of contrast by both kidneys. The visualized ureters appear unremarkable. The urinary bladder is minimally distended and grossly  unremarkable. Stomach/Bowel: There is large amount of stool throughout the colon. There is no bowel obstruction or active inflammation. The appendix is normal. Vascular/Lymphatic: Moderate aortoiliac atherosclerotic disease. The IVC is unremarkable. No portal venous gas. There is no adenopathy. Reproductive: The prostate and seminal vesicles are grossly unremarkable. No pelvic mass. Other: Small fat containing right inguinal hernia. There is slight haziness of the herniated fat which may represent edema. Clinical correlation recommended to evaluate for possibility of strangulation or incarceration. No bowel herniation. Musculoskeletal: Degenerative changes of the spine and multiple spurring/osteophyte. No acute osseous pathology. IMPRESSION: 1. A fat containing right inguinal hernia with possible strangulation or incarceration. Clinical correlation is recommended. No other acute intra-abdominal or pelvic pathology. 2. Constipation.  No bowel obstruction. Normal appendix. 3.  Aortic Atherosclerosis (ICD10-I70.0). Electronically Signed   By: Elgie Collard M.D.   On: 07/02/2023 19:53    Pending Labs Unresulted Labs (From admission, onward)    None       Vitals/Pain Today's Vitals   07/02/23 2245 07/02/23 2300 07/02/23 2345 07/03/23 0000  BP: (!) 170/74 (!) 150/71 (!) 158/72 (!) 163/86  Pulse: 61 62 67 62  Resp: 18  (!) 21 (!) 8  Temp: 98.6 F (37 C)     TempSrc:      SpO2: 98% 97% 98% 99%  Weight:      Height:      PainSc: 0-No pain       Isolation Precautions No active isolations  Medications Medications  0.9 %  sodium chloride infusion (has no administration in time range)  acetaminophen (TYLENOL) tablet 650 mg (650 mg Oral Given 07/02/23 1820)  ondansetron (ZOFRAN) injection 4 mg (4 mg Intravenous Given 07/02/23 1819)  iohexol (OMNIPAQUE) 300 MG/ML solution 80 mL (80 mLs Intravenous Contrast Given 07/02/23 1934)  morphine (PF) 4 MG/ML injection 4 mg (4 mg Intravenous Given  07/02/23 2012)  polyethylene glycol (MIRALAX / GLYCOLAX) packet 17 g (17 g Oral Given 07/02/23 2015)  ondansetron (ZOFRAN) injection 4 mg (4 mg Intravenous Given 07/02/23 2015)  meclizine (ANTIVERT) tablet 12.5  mg (12.5 mg Oral Given 07/02/23 2212)  hydrALAZINE (APRESOLINE) injection 10 mg (10 mg Intramuscular Given 07/02/23 2212)    Mobility walks     Focused Assessments    R Recommendations: See Admitting Provider Note  Report given to:   Additional Notes:

## 2023-07-03 NOTE — Plan of Care (Signed)

## 2023-07-04 ENCOUNTER — Other Ambulatory Visit: Payer: Self-pay

## 2023-07-04 ENCOUNTER — Inpatient Hospital Stay: Payer: Medicare Other

## 2023-07-04 DIAGNOSIS — I493 Ventricular premature depolarization: Secondary | ICD-10-CM

## 2023-07-04 DIAGNOSIS — R42 Dizziness and giddiness: Secondary | ICD-10-CM | POA: Diagnosis not present

## 2023-07-04 DIAGNOSIS — I10 Essential (primary) hypertension: Secondary | ICD-10-CM | POA: Diagnosis not present

## 2023-07-04 LAB — BASIC METABOLIC PANEL
Anion gap: 9 (ref 5–15)
BUN: 12 mg/dL (ref 8–23)
CO2: 26 mmol/L (ref 22–32)
Calcium: 8.6 mg/dL — ABNORMAL LOW (ref 8.9–10.3)
Chloride: 104 mmol/L (ref 98–111)
Creatinine, Ser: 1.15 mg/dL (ref 0.61–1.24)
GFR, Estimated: >60 mL/min (ref >60)
Glucose, Bld: 88 mg/dL (ref 70–99)
Potassium: 3.7 mmol/L (ref 3.5–5.1)
Sodium: 139 mmol/L (ref 135–145)

## 2023-07-04 LAB — CBC
HCT: 36.5 % — ABNORMAL LOW (ref 39.0–52.0)
Hemoglobin: 12.2 g/dL — ABNORMAL LOW (ref 13.0–17.0)
MCH: 33.7 pg (ref 26.0–34.0)
MCHC: 33.4 g/dL (ref 30.0–36.0)
MCV: 100.8 fL — ABNORMAL HIGH (ref 80.0–100.0)
Platelets: 148 10*3/uL — ABNORMAL LOW (ref 150–400)
RBC: 3.62 MIL/uL — ABNORMAL LOW (ref 4.22–5.81)
RDW: 11.8 % (ref 11.5–15.5)
WBC: 5.5 10*3/uL (ref 4.0–10.5)
nRBC: 0 % (ref 0.0–0.2)

## 2023-07-04 LAB — MAGNESIUM: Magnesium: 2 mg/dL (ref 1.7–2.4)

## 2023-07-04 MED ORDER — LOSARTAN POTASSIUM 50 MG PO TABS
100.0000 mg | ORAL_TABLET | Freq: Every day | ORAL | Status: DC
  Administered 2023-07-04: 100 mg via ORAL
  Filled 2023-07-04: qty 2

## 2023-07-04 MED ORDER — POTASSIUM CHLORIDE CRYS ER 20 MEQ PO TBCR
20.0000 meq | EXTENDED_RELEASE_TABLET | Freq: Once | ORAL | Status: AC
  Administered 2023-07-04: 20 meq via ORAL
  Filled 2023-07-04: qty 1

## 2023-07-04 MED ORDER — CARVEDILOL 3.125 MG PO TABS: 3.1250 mg | ORAL_TABLET | Freq: Two times a day (BID) | ORAL | 2 refills | Status: DC

## 2023-07-04 NOTE — TOC Transition Note (Signed)
Transition of Care [facility]) - Discharge Note   Patient Details  Name: Dennis Gilbert MRN: [id number] Date of Birth: 03-Jul-1946  [facility]) CM/SW Contact:  Elliot Gault, LCSW Phone Number: 07/04/2023, 12:51 PM   Clinical Narrative:     Pt stable for dc home per MD. Sherron Monday with pt/family about dc planning and HH recommendation. Pt agreeable to [facility] RN but does not feel he needs PT/OT. CMS provider options reviewed and referred as requested.  Pt's family states that they will be available to assist pt at home.   No other TOC needs for dc.  Final next level of care: Home w Home Health Services Barriers to Discharge: Barriers Resolved   Patient Goals and CMS Choice Patient states their goals for this hospitalization and ongoing recovery are:: to get better and go home. CMS Medicare.gov Compare Post Acute Care list provided to:: Patient Choice offered to / list presented to : Patient [place] ownership interest in [facility].provided to:: Patient    Discharge Placement                       Discharge Plan and Services Additional resources added to the After Visit Summary for                            [facility] Arranged: RN [facility] Agency: [facility] Date [facility] Agency Contacted: 07/04/23   Representative spoke with at [facility] Agency: Kandee Keen  Social Drivers of Health (SDOH) Interventions SDOH Screenings   Food Insecurity: No Food Insecurity (07/03/2023)  Housing: Low Risk  (07/03/2023)  Transportation Needs: No Transportation Needs (07/03/2023)  Utilities: Not At Risk (07/03/2023)  Tobacco Use: Low Risk  (07/02/2023)     Readmission Risk Interventions     No data to display

## 2023-07-04 NOTE — Progress Notes (Addendum)
Progress Note  Patient Name: Dennis Gilbert Date of Encounter: 07/04/2023  Primary Cardiologist: Nona Dell, MD  Subjective   GoLytely has begun to work and he is passing dark brown BM without any melena or blood reported. He has been up and down to the bathroom several times. He denies any CP, SOB, palpitations. Dizziness has resolved as well.  Inpatient Medications    Scheduled Meds:  carvedilol  3.125 mg Oral BID WC   losartan  100 mg Oral Daily    PRN Meds: bisacodyl, fentaNYL (SUBLIMAZE) injection, hydrALAZINE, ondansetron **OR** ondansetron (ZOFRAN) IV, mouth rinse, traMADol   Vital Signs    Vitals:   07/03/23 2001 07/03/23 2057 07/04/23 0414 07/04/23 0752  BP: (!) 192/80 (!) 138/56 (!) 154/62   Pulse:   77   Resp: 17  18 11   Temp:   98 F (36.7 C)   TempSrc:   Oral   SpO2:   96%   Weight:      Height:        Intake/Output Summary (Last 24 hours) at 07/04/2023 0837 Last data filed at 07/04/2023 1610 Gross per 24 hour  Intake 1000 ml  Output 300 ml  Net 700 ml      07/02/2023    4:10 PM 05/18/2020    7:48 AM 05/13/2020   10:27 AM  Last 3 Weights  Weight (lbs) 135 lb 132 lb 134 lb  Weight (kg) 61.236 kg 59.875 kg 60.782 kg     Telemetry    NSR with frequent PVCs - Personally Reviewed  Physical Exam   GEN: No acute distress.  HEENT: Normocephalic, atraumatic, sclera non-icteric. Neck: No JVD or bruits. Cardiac: irregular due to frequent ectopy, no murmurs, rubs, or gallops.  Respiratory: Clear to auscultation bilaterally. Breathing is unlabored. GI: Soft, nontender, non-distended, BS +x 4. MS: no deformity. Extremities: No clubbing or cyanosis. No edema. Distal pedal pulses are 2+ and equal bilaterally. Neuro:  AAOx3. Follows commands. Psych:  Responds to questions appropriately with a normal affect.  Labs    Chemistry Recent Labs  Lab 07/02/23 1816 07/03/23 0433 07/04/23 0351  NA 138 136 139  K 4.3 4.3 3.7  CL 100 100 104  CO2  29 30 26   GLUCOSE 196* 144* 88  BUN 16 16 12   CREATININE 1.35* 1.29* 1.15  CALCIUM 9.7 9.2 8.6*  PROT 7.1 6.5  --   ALBUMIN 4.1 3.7  --   AST 29 26  --   ALT 20 20  --   ALKPHOS 55 49  --   BILITOT 1.0 0.8  --   GFRNONAA 54* 57* >60  ANIONGAP 9 6 9      Hematology Recent Labs  Lab 07/02/23 1816 07/03/23 0433 07/04/23 0351  WBC 5.6 6.7 5.5  RBC 4.01* 3.74* 3.62*  HGB 13.4 12.7* 12.2*  HCT 40.1 37.8* 36.5*  MCV 100.0 101.1* 100.8*  MCH 33.4 34.0 33.7  MCHC 33.4 33.6 33.4  RDW 11.6 11.7 11.8  PLT 151 140* 148*    Radiology    ECHOCARDIOGRAM COMPLETE Result Date: 07/03/2023    ECHOCARDIOGRAM REPORT   Patient Name:   Dennis Gilbert Date of Exam: 07/03/2023 Medical Rec #:  [id number]     Height:       66.0 in Accession #:    [id number]    Weight:       135.0 lb Date of Birth:  01/26/1946     BSA:  1.692 m Patient Age:    77 years      BP:           161/82 mmHg Patient Gender: M             HR:           61 bpm. [facility] Procedure: 2D Echo, Cardiac Doppler and Color Doppler Indications:    Abnormal EKG R94.31  History:        Patient has no prior history of Echocardiogram examinations.                 Risk Factors:Hypertension.  Sonographer:    Celesta Gentile RCS Referring Phys: [phone] CHRISTOPHER RONALD BERGE IMPRESSIONS  1. Left ventricular ejection fraction, by estimation, is 60 to 65%. The left ventricle has normal function. The left ventricle has no regional wall motion abnormalities. There is mild concentric left ventricular hypertrophy. Left ventricular diastolic parameters are consistent with Grade I diastolic dysfunction (impaired relaxation).  2. Right ventricular systolic function is normal. The right ventricular size is normal. Tricuspid regurgitation signal is inadequate for assessing PA pressure.  3. Left atrial size was severely dilated.  4. The mitral valve is grossly normal. Mild mitral valve regurgitation.  5. The aortic valve is tricuspid. Aortic  valve regurgitation is not visualized.  6. The inferior vena cava is normal in size with greater than 50% respiratory variability, suggesting right atrial pressure of 3 mmHg. Comparison(s): No prior Echocardiogram. FINDINGS  Left Ventricle: Left ventricular ejection fraction, by estimation, is 60 to 65%. The left ventricle has normal function. The left ventricle has no regional wall motion abnormalities. The left ventricular internal cavity size was normal in size. There is  mild concentric left ventricular hypertrophy. Left ventricular diastolic parameters are consistent with Grade I diastolic dysfunction (impaired relaxation). Right Ventricle: The right ventricular size is normal. No increase in right ventricular wall thickness. Right ventricular systolic function is normal. Tricuspid regurgitation signal is inadequate for assessing PA pressure. Left Atrium: Left atrial size was severely dilated. Right Atrium: Right atrial size was normal in size. Pericardium: There is no evidence of pericardial effusion. Mitral Valve: The mitral valve is grossly normal. Mild mitral valve regurgitation. Tricuspid Valve: The tricuspid valve is grossly normal. Tricuspid valve regurgitation is trivial. Aortic Valve: The aortic valve is tricuspid. There is mild aortic valve annular calcification. Aortic valve regurgitation is not visualized. Pulmonic Valve: The pulmonic valve was grossly normal. Pulmonic valve regurgitation is trivial. Aorta: The aortic root is normal in size and structure. Venous: The inferior vena cava is normal in size with greater than 50% respiratory variability, suggesting right atrial pressure of 3 mmHg. IAS/Shunts: No atrial level shunt detected by color flow Doppler.  LEFT VENTRICLE PLAX 2D LVIDd:         4.60 cm   Diastology LVIDs:         3.00 cm   LV e' medial:    4.79 cm/s LV PW:         1.10 cm   LV E/e' medial:  14.4 LV IVS:        1.20 cm   LV e' lateral:   8.81 cm/s LVOT diam:     2.10 cm   LV E/e'  lateral: 7.9 LV SV:         103 LV SV Index:   61 LVOT Area:     3.46 cm  RIGHT VENTRICLE RV S prime:  19.60 cm/s TAPSE (M-mode): 2.4 cm LEFT ATRIUM             Index        RIGHT ATRIUM           Index LA diam:        3.60 cm 2.13 cm/m   RA Area:     18.70 cm LA Vol (A2C):   92.2 ml 54.49 ml/m  RA Volume:   51.10 ml  30.20 ml/m LA Vol (A4C):   99.0 ml 58.50 ml/m LA Biplane Vol: 98.0 ml 57.91 ml/m  AORTIC VALVE LVOT Vmax:   134.00 cm/s LVOT Vmean:  87.200 cm/s LVOT VTI:    0.297 m  AORTA Ao Root diam: 3.40 cm MITRAL VALVE MV Area (PHT): 2.99 cm    SHUNTS MV Decel Time: 254 msec    Systemic VTI:  0.30 m MV E velocity: 69.20 cm/s  Systemic Diam: 2.10 cm MV A velocity: 64.80 cm/s MV E/A ratio:  1.07 Nona Dell MD Electronically signed by Nona Dell MD Signature Date/Time: 07/03/2023/3:04:20 PM    Final    CT Head Wo Contrast Result Date: 07/02/2023 CLINICAL DATA:  Syncope, dizziness EXAM: CT HEAD WITHOUT CONTRAST TECHNIQUE: Contiguous axial images were obtained from the base of the skull through the vertex without intravenous contrast. RADIATION DOSE REDUCTION: This exam was performed according to the departmental dose-optimization program which includes automated exposure control, adjustment of the mA and/or kV according to patient size and/or use of iterative reconstruction technique. COMPARISON:  08/18/2005 FINDINGS: Brain: There is atrophy and chronic small vessel disease changes. No acute intracranial abnormality. Specifically, no hemorrhage, hydrocephalus, mass lesion, acute infarction, or significant intracranial injury. Vascular: No hyperdense vessel or unexpected calcification. Skull: No acute calvarial abnormality. Sinuses/Orbits: No acute findings Other: None IMPRESSION: Atrophy, chronic microvascular disease. No acute intracranial abnormality. Electronically Signed   By: Charlett Nose M.D.   On: 07/02/2023 22:08   CT ABDOMEN PELVIS W CONTRAST Result Date: 07/02/2023 CLINICAL  DATA:  Left lower quadrant abdominal pain. EXAM: CT ABDOMEN AND PELVIS WITH CONTRAST TECHNIQUE: Multidetector CT imaging of the abdomen and pelvis was performed using the standard protocol following bolus administration of intravenous contrast. RADIATION DOSE REDUCTION: This exam was performed according to the departmental dose-optimization program which includes automated exposure control, adjustment of the mA and/or kV according to patient size and/or use of iterative reconstruction technique. CONTRAST:  80mL OMNIPAQUE IOHEXOL 300 MG/ML  SOLN COMPARISON:  None Available. FINDINGS: Lower chest: The visualized lung bases are clear. There is coronary vascular calcification. No intra-abdominal free air or free fluid. Hepatobiliary: The liver is unremarkable. There is mild biliary dilatation, post cholecystectomy. No retained calcified stone noted in the central CBD. Pancreas: Unremarkable. No pancreatic ductal dilatation or surrounding inflammatory changes. Spleen: Normal in size without focal abnormality. Adrenals/Urinary Tract: The left adrenal gland is unremarkable. There is a 2 cm fatty lesion in the right adrenal gland, likely a myelolipoma. There is no hydronephrosis on either side. There is symmetric enhancement and excretion of contrast by both kidneys. The visualized ureters appear unremarkable. The urinary bladder is minimally distended and grossly unremarkable. Stomach/Bowel: There is large amount of stool throughout the colon. There is no bowel obstruction or active inflammation. The appendix is normal. Vascular/Lymphatic: Moderate aortoiliac atherosclerotic disease. The IVC is unremarkable. No portal venous gas. There is no adenopathy. Reproductive: The prostate and seminal vesicles are grossly unremarkable. No pelvic mass. Other: Small fat containing right inguinal hernia. There is slight  haziness of the herniated fat which may represent edema. Clinical correlation recommended to evaluate for possibility  of strangulation or incarceration. No bowel herniation. Musculoskeletal: Degenerative changes of the spine and multiple spurring/osteophyte. No acute osseous pathology. IMPRESSION: 1. A fat containing right inguinal hernia with possible strangulation or incarceration. Clinical correlation is recommended. No other acute intra-abdominal or pelvic pathology. 2. Constipation.  No bowel obstruction. Normal appendix. 3.  Aortic Atherosclerosis (ICD10-I70.0). Electronically Signed   By: Elgie Collard M.D.   On: 07/02/2023 19:53    Patient Profile     77 y.o. male with HTN, HLD, CKD 3a presented to the ED with worsening abdominal pain, anorexia, dizziness, BRBPR superimposed on a month of constipation and abdominal bloating. Found to have low grade temp and hypertensive with + FOBT on arrival. Cardiology consulted for frequent PVCs.  Assessment & Plan    1. Constipation, LLQ pain with mild macrocytic anemia and mild thrombocytopenia - FOBT + - CT showing constipation, no bowel obstruction, fat containing right inguinal hernia with possible strangulation or incarceration - per IM/GI. Per discussion with Dr. Diona Browner, if preop clearance requested for colonoscopy as outpatient, OK to proceed given LVEF normal and otherwise no anginal symptoms   2. Dizziness with underlying HTN  - orthostatics were negative this admission; predominantly hypertensive - PT eval demonstrated dizziness that began in sitting that seemed to increase in standing - CT head nonacute, + chronic changes - not clearly cardiac related since this has completely resolved though PVCs continue and EF is normal - maybe due to HTN? - on losartan at home, started carvedilol 07/03/23  3. PVCs - also with what sounds like bradysphygmia at home (HR registering 30s-40s by pulse ox likely not picking up PVCs, tele HR 60s-70s) - 2D echo this admission with EF 60-65%, mild LVH, G1DD, severely dilated LA, mild MR - recommend keeping K 4.0 or  greater, Mg 2.0 - Mg has been fine, K slightly below goal today - will give KCl x 1 - no ischemic symptoms of CP or SOB - TSH OK - started on carvedilol, first dose last night - will plan for 3 day monitor mailed to patient's home to quantify burden  4. CKD 3a - Cr appears similar to prior today  F/u arranged 08/07/23, will relay on AVS   For questions or updates, please contact [facility] Please consult [email or web address] for contact info under Cardiology/STEMI.  Signed, Laurann Montana, PA-C 07/04/2023, 8:37 AM     Attending note:  Patient seen and examined.  I reviewed the chart and discussed the case with Ms. Jetta Lout, agree with her above findings.  Mr. Pellett states that he feels better, improved abdominal symptoms on GoLytely and beginning to pass stool without obvious blood.  He does not report any palpitations or chest pain.  On examination he appears comfortable seated in bedside chair.  Afebrile, heart rate in the 70s in sinus rhythm with frequent PVCs, no bradycardia or heart block noted.  Lungs are clear.  Cardiac exam with frequent ectopy.  Pertinent lab work includes potassium 3.7, creatinine 1.15, hemoglobin 12.2, platelets 148, TSH 1.626.  Echocardiogram yesterday revealed LVEF 60 to 65% without regional wall motion abnormalities.  Left atrium severely dilated with mild mitral regurgitation.  From a cardiac perspective, no further inpatient testing is planned.  We will set him up to receive a 72-hour Zio patch as an outpatient to better quantify PVC burden and then arrange office follow-up from there.  Continue present medications including Coreg and losartan.  Jonelle Sidle, M.D., F.A.C.C.

## 2023-07-04 NOTE — Discharge Summary (Signed)
Physician Discharge Summary  Dennis Gilbert EXB:[id number] DOB: 1946-06-30 DOA: 07/02/2023  PCP: Benita Stabile, MD  Admit date: 07/02/2023  Discharge date: 07/04/2023  Admitted From:Home  Disposition:  Home  Recommendations for Outpatient Follow-up:  Follow up with PCP in 1-2 weeks Follow-up with cardiology as scheduled Follow-up with GI as scheduled in 2-3 weeks Continue on Benefiber and MiraLAX to help with bowel movements Continue on losartan and Coreg as prescribed by cardiology and remain on Zio patch for 72 hours Continue other home medications as prior  Home Health: Yes with home health RN  Equipment/Devices: Zio patch  Discharge Condition:Stable  CODE STATUS: Full  Diet recommendation: Heart Healthy  Brief/Interim Summary:  Dennis Gilbert is a 77 y.o. male with medical history significant of hypertension who presents to the emergency department accompanied by daughter or son-in-law due to abdominal pain and constipation.  Patient was admitted for further evaluation of this and has had a bloody bowel movement for which GI evaluation is pending.  He is also noted to have some dizziness that requires further evaluation.  He was noted to have abdominal pain related to constipation and had GI evaluation with recommendations for laxation with GoLytely as well as MiraLAX.  He has now had 2 bowel movements and has no further abdominal symptoms.  He was also noted to have frequent PVCs that were contributing likely to his sensation of dizziness and he was evaluated by cardiology with 2D echocardiogram with preserved LVEF as noted below and TSH within normal limits.  He has now been started on Coreg with adequate heart rate control and no further symptomatology.  He will be discharged with Zio patch for further outpatient evaluation and appointments with GI and cardiology have been scheduled.  Discharge Diagnoses:  Principal Problem:   Dizziness of unknown cause Active Problems:    Abdominal pain   Nausea   Constipation   Dehydration   Essential hypertension   GI bleed   Sinus bradycardia   Hyperglycemia  Principal discharge diagnosis: Abdominal pain and nausea secondary to severe constipation with spontaneously resolved GI bleed.  Intermittent bradycardia with frequent PVCs.  Discharge Instructions  Discharge Instructions     Ambulatory referral to Cardiology   Complete by: As directed    If you have not heard from the Cardiology office within the next 72 hours please call [phone].   Diet - low sodium heart healthy   Complete by: As directed    Increase activity slowly   Complete by: As directed       Allergies as of 07/04/2023   No Known Allergies      Medication List     TAKE these medications    BENEFIBER PO Take 1 Scoop by mouth daily as needed (constipation).   bisacodyl 5 MG EC tablet Commonly known as: DULCOLAX Take 5 mg by mouth daily as needed for moderate constipation.   carvedilol 3.125 MG tablet Commonly known as: COREG Take 1 tablet (3.125 mg total) by mouth 2 (two) times daily with a meal.   losartan 100 MG tablet Commonly known as: COZAAR Take 100 mg by mouth daily.   mineral oil enema Place 1 enema rectally once.   polyethylene glycol 17 g packet Commonly known as: MIRALAX / GLYCOLAX Take 17 g by mouth daily. What changed: Another medication with the same name was added. Make sure you understand how and when to take each.   polyethylene glycol 17 g packet Commonly known as: MiraLax Take  17 g by mouth in the morning, at noon, and at bedtime. Continue using this medicine until your stools have become loose - then may descrease dosing until you have a normal stool without constipation What changed: You were already taking a medication with the same name, and this prescription was added. Make sure you understand how and when to take each.   traMADol 50 MG tablet Commonly known as: ULTRAM Take 50 mg by mouth  daily as needed (Back pain).        Follow-up Information     Benita Stabile, MD. Schedule an appointment as soon as possible for a visit .   Specialty: Internal Medicine Why: As needed, If symptoms worsen Contact information: [address] [place] [facility] [address] [phone]         Laurann Montana, PA-C Follow up.   Specialties: Cardiology, Radiology Why: [facility] office located within [facility] Monday Aug 07, 2023 at 3:30 PM Arrive 15 minutes prior to appointment to check in.  The cardiology office will also be mailing you a heart monitor to wear for 3 days. It will come with instructions how to apply and mail back in. Please call the monitor company or our office if you have any questions. Contact information: [address] [place] [address] [phone]         [facility]. Go to.   Contact information: [address] [place] [address] [phone]               No Known Allergies  Consultations: Cardiology GI   Procedures/Studies: ECHOCARDIOGRAM COMPLETE Result Date: 07/03/2023    ECHOCARDIOGRAM REPORT   Patient Name:   Dennis Gilbert Date of Exam: 07/03/2023 Medical Rec #:  [id number]     Height:       66.0 in Accession #:    [id number]    Weight:       135.0 lb Date of Birth:  [birth year]     BSA:          1.692 m Patient Age:    77 years      BP:           161/82 mmHg Patient Gender: M             HR:           61 bpm. [facility] Procedure: 2D Echo, Cardiac Doppler and Color Doppler Indications:    Abnormal EKG R94.31  History:        Patient has no prior history of Echocardiogram examinations.                 Risk Factors:Hypertension.  Sonographer:    Celesta Gentile RCS Referring Phys: [phone] CHRISTOPHER RONALD BERGE IMPRESSIONS  1. Left ventricular ejection fraction, by estimation, is 60 to 65%. The left ventricle has normal function. The left ventricle has no  regional wall motion abnormalities. There is mild concentric left ventricular hypertrophy. Left ventricular diastolic parameters are consistent with Grade I diastolic dysfunction (impaired relaxation).  2. Right ventricular systolic function is normal. The right ventricular size is normal. Tricuspid regurgitation signal is inadequate for assessing PA pressure.  3. Left atrial size was severely dilated.  4. The mitral valve is grossly normal. Mild mitral valve regurgitation.  5. The aortic valve is tricuspid. Aortic valve regurgitation is not visualized.  6. The inferior vena cava is normal in size with greater than  50% respiratory variability, suggesting right atrial pressure of 3 mmHg. Comparison(s): No prior Echocardiogram. FINDINGS  Left Ventricle: Left ventricular ejection fraction, by estimation, is 60 to 65%. The left ventricle has normal function. The left ventricle has no regional wall motion abnormalities. The left ventricular internal cavity size was normal in size. There is  mild concentric left ventricular hypertrophy. Left ventricular diastolic parameters are consistent with Grade I diastolic dysfunction (impaired relaxation). Right Ventricle: The right ventricular size is normal. No increase in right ventricular wall thickness. Right ventricular systolic function is normal. Tricuspid regurgitation signal is inadequate for assessing PA pressure. Left Atrium: Left atrial size was severely dilated. Right Atrium: Right atrial size was normal in size. Pericardium: There is no evidence of pericardial effusion. Mitral Valve: The mitral valve is grossly normal. Mild mitral valve regurgitation. Tricuspid Valve: The tricuspid valve is grossly normal. Tricuspid valve regurgitation is trivial. Aortic Valve: The aortic valve is tricuspid. There is mild aortic valve annular calcification. Aortic valve regurgitation is not visualized. Pulmonic Valve: The pulmonic valve was grossly normal. Pulmonic valve regurgitation  is trivial. Aorta: The aortic root is normal in size and structure. Venous: The inferior vena cava is normal in size with greater than 50% respiratory variability, suggesting right atrial pressure of 3 mmHg. IAS/Shunts: No atrial level shunt detected by color flow Doppler.  LEFT VENTRICLE PLAX 2D LVIDd:         4.60 cm   Diastology LVIDs:         3.00 cm   LV e' medial:    4.79 cm/s LV PW:         1.10 cm   LV E/e' medial:  14.4 LV IVS:        1.20 cm   LV e' lateral:   8.81 cm/s LVOT diam:     2.10 cm   LV E/e' lateral: 7.9 LV SV:         103 LV SV Index:   61 LVOT Area:     3.46 cm  RIGHT VENTRICLE RV S prime:     19.60 cm/s TAPSE (M-mode): 2.4 cm LEFT ATRIUM             Index        RIGHT ATRIUM           Index LA diam:        3.60 cm 2.13 cm/m   RA Area:     18.70 cm LA Vol (A2C):   92.2 ml 54.49 ml/m  RA Volume:   51.10 ml  30.20 ml/m LA Vol (A4C):   99.0 ml 58.50 ml/m LA Biplane Vol: 98.0 ml 57.91 ml/m  AORTIC VALVE LVOT Vmax:   134.00 cm/s LVOT Vmean:  87.200 cm/s LVOT VTI:    0.297 m  AORTA Ao Root diam: 3.40 cm MITRAL VALVE MV Area (PHT): 2.99 cm    SHUNTS MV Decel Time: 254 msec    Systemic VTI:  0.30 m MV E velocity: 69.20 cm/s  Systemic Diam: 2.10 cm MV A velocity: 64.80 cm/s MV E/A ratio:  1.07 Nona Dell MD Electronically signed by Nona Dell MD Signature Date/Time: 07/03/2023/3:04:20 PM    Final    CT Head Wo Contrast Result Date: 07/02/2023 CLINICAL DATA:  Syncope, dizziness EXAM: CT HEAD WITHOUT CONTRAST TECHNIQUE: Contiguous axial images were obtained from the base of the skull through the vertex without intravenous contrast. RADIATION DOSE REDUCTION: This exam was performed according to the departmental dose-optimization program which includes automated  exposure control, adjustment of the mA and/or kV according to patient size and/or use of iterative reconstruction technique. COMPARISON:  08/18/2005 FINDINGS: Brain: There is atrophy and chronic small vessel disease changes.  No acute intracranial abnormality. Specifically, no hemorrhage, hydrocephalus, mass lesion, acute infarction, or significant intracranial injury. Vascular: No hyperdense vessel or unexpected calcification. Skull: No acute calvarial abnormality. Sinuses/Orbits: No acute findings Other: None IMPRESSION: Atrophy, chronic microvascular disease. No acute intracranial abnormality. Electronically Signed   By: Charlett Nose M.D.   On: 07/02/2023 22:08   CT ABDOMEN PELVIS W CONTRAST Result Date: 07/02/2023 CLINICAL DATA:  Left lower quadrant abdominal pain. EXAM: CT ABDOMEN AND PELVIS WITH CONTRAST TECHNIQUE: Multidetector CT imaging of the abdomen and pelvis was performed using the standard protocol following bolus administration of intravenous contrast. RADIATION DOSE REDUCTION: This exam was performed according to the departmental dose-optimization program which includes automated exposure control, adjustment of the mA and/or kV according to patient size and/or use of iterative reconstruction technique. CONTRAST:  80mL OMNIPAQUE IOHEXOL 300 MG/ML  SOLN COMPARISON:  None Available. FINDINGS: Lower chest: The visualized lung bases are clear. There is coronary vascular calcification. No intra-abdominal free air or free fluid. Hepatobiliary: The liver is unremarkable. There is mild biliary dilatation, post cholecystectomy. No retained calcified stone noted in the central CBD. Pancreas: Unremarkable. No pancreatic ductal dilatation or surrounding inflammatory changes. Spleen: Normal in size without focal abnormality. Adrenals/Urinary Tract: The left adrenal gland is unremarkable. There is a 2 cm fatty lesion in the right adrenal gland, likely a myelolipoma. There is no hydronephrosis on either side. There is symmetric enhancement and excretion of contrast by both kidneys. The visualized ureters appear unremarkable. The urinary bladder is minimally distended and grossly unremarkable. Stomach/Bowel: There is large amount of  stool throughout the colon. There is no bowel obstruction or active inflammation. The appendix is normal. Vascular/Lymphatic: Moderate aortoiliac atherosclerotic disease. The IVC is unremarkable. No portal venous gas. There is no adenopathy. Reproductive: The prostate and seminal vesicles are grossly unremarkable. No pelvic mass. Other: Small fat containing right inguinal hernia. There is slight haziness of the herniated fat which may represent edema. Clinical correlation recommended to evaluate for possibility of strangulation or incarceration. No bowel herniation. Musculoskeletal: Degenerative changes of the spine and multiple spurring/osteophyte. No acute osseous pathology. IMPRESSION: 1. A fat containing right inguinal hernia with possible strangulation or incarceration. Clinical correlation is recommended. No other acute intra-abdominal or pelvic pathology. 2. Constipation.  No bowel obstruction. Normal appendix. 3.  Aortic Atherosclerosis (ICD10-I70.0). Electronically Signed   By: Elgie Collard M.D.   On: 07/02/2023 19:53     Discharge Exam: Vitals:   07/04/23 0414 07/04/23 0752  BP: (!) 154/62   Pulse: 77   Resp: 18 11  Temp: 98 F (36.7 C)   SpO2: 96%    Vitals:   07/03/23 2001 07/03/23 2057 07/04/23 0414 07/04/23 0752  BP: (!) 192/80 (!) 138/56 (!) 154/62   Pulse:   77   Resp: 17  18 11   Temp:   98 F (36.7 C)   TempSrc:   Oral   SpO2:   96%   Weight:      Height:        General: Pt is alert, awake, not in acute distress Cardiovascular: RRR, S1/S2 +, no rubs, no gallops Respiratory: CTA bilaterally, no wheezing, no rhonchi Abdominal: Soft, NT, ND, bowel sounds + Extremities: no edema, no cyanosis    The results of significant diagnostics from this hospitalization (  including imaging, microbiology, ancillary and laboratory) are listed below for reference.     Microbiology: No results found for this or any previous visit (from the past 240 hours).   Labs: BNP (last  3 results) No results for input(s): "BNP" in the last 8760 hours. Basic Metabolic Panel: Recent Labs  Lab 07/02/23 1816 07/03/23 0433 07/04/23 0351  NA 138 136 139  K 4.3 4.3 3.7  CL 100 100 104  CO2 29 30 26   GLUCOSE 196* 144* 88  BUN 16 16 12   CREATININE 1.35* 1.29* 1.15  CALCIUM 9.7 9.2 8.6*  MG  --  2.4 2.0  PHOS  --  3.1  --    Liver Function Tests: Recent Labs  Lab 07/02/23 1816 07/03/23 0433  AST 29 26  ALT 20 20  ALKPHOS 55 49  BILITOT 1.0 0.8  PROT 7.1 6.5  ALBUMIN 4.1 3.7   Recent Labs  Lab 07/02/23 1816  LIPASE 23   No results for input(s): "AMMONIA" in the last 168 hours. CBC: Recent Labs  Lab 07/02/23 1816 07/03/23 0433 07/04/23 0351  WBC 5.6 6.7 5.5  NEUTROABS 4.5  --   --   HGB 13.4 12.7* 12.2*  HCT 40.1 37.8* 36.5*  MCV 100.0 101.1* 100.8*  PLT 151 140* 148*   Cardiac Enzymes: No results for input(s): "CKTOTAL", "CKMB", "CKMBINDEX", "TROPONINI" in the last 168 hours. BNP: Invalid input(s): "POCBNP" CBG: Recent Labs  Lab 07/03/23 0055  GLUCAP 130*   D-Dimer No results for input(s): "DDIMER" in the last 72 hours. Hgb A1c Recent Labs    07/03/23 0433  HGBA1C 5.6   Lipid Profile No results for input(s): "CHOL", "HDL", "LDLCALC", "TRIG", "CHOLHDL", "LDLDIRECT" in the last 72 hours. Thyroid function studies Recent Labs    07/03/23 0433  TSH 1.626   Anemia work up No results for input(s): "VITAMINB12", "FOLATE", "FERRITIN", "TIBC", "IRON", "RETICCTPCT" in the last 72 hours. Urinalysis    Component Value Date/Time   COLORURINE YELLOW 07/02/2023 1834   APPEARANCEUR CLEAR 07/02/2023 1834   LABSPEC 1.015 07/02/2023 1834   PHURINE 6.0 07/02/2023 1834   GLUCOSEU 50 (A) 07/02/2023 1834   HGBUR NEGATIVE 07/02/2023 1834   BILIRUBINUR NEGATIVE 07/02/2023 1834   KETONESUR 5 (A) 07/02/2023 1834   PROTEINUR 30 (A) 07/02/2023 1834   NITRITE NEGATIVE 07/02/2023 1834   LEUKOCYTESUR NEGATIVE 07/02/2023 1834   Sepsis Labs Recent  Labs  Lab 07/02/23 1816 07/03/23 0433 07/04/23 0351  WBC 5.6 6.7 5.5   Microbiology No results found for this or any previous visit (from the past 240 hours).   Time coordinating discharge: 35 minutes  SIGNED:   Erick Blinks, DO [facility] 07/04/2023, 11:37 AM  If 7PM-7AM, please contact night-coverage [email or web address]

## 2023-07-06 ENCOUNTER — Telehealth: Payer: Self-pay

## 2023-07-06 NOTE — Telephone Encounter (Signed)
Patient has been scheduled

## 2023-07-06 NOTE — Transitions of Care (Post Inpatient/ED Visit) (Signed)
07/06/2023  Name: Dennis Gilbert MRN: [id number] DOB: 1946/07/01  Today's TOC FU Call Status: Today's TOC FU Call Status:: Successful TOC FU Call Completed TOC FU Call Complete Date: 07/06/23 Patient's Name and Date of Birth confirmed.  Transition Care Management Follow-up Telephone Call Date of Discharge: 07/04/23 [name] Type of Discharge: Inpatient Admission Primary Inpatient Discharge Diagnosis:: Constipation and abdominal How have you been since you were released from the hospital?: Better Any questions or concerns?: No  Items Reviewed: Did you receive and understand the discharge instructions provided?: Yes Medications obtained,verified, and reconciled?: Yes (Medications Reviewed) Any new allergies since your discharge?: Yes Dietary orders reviewed?: NA Do you have support at home?: Yes People in Home: alone Name of Support/Comfort Primary Source: Dennis Gilbert, daughter  Medications Reviewed Today: Medications Reviewed Today     Reviewed by Redge Gainer, RN (Case Manager) on 07/06/23 at 1403  Med List Status: <None>   Medication Order Taking? Sig Documenting Provider Last Dose Status Informant  bisacodyl (DULCOLAX) 5 MG EC tablet [id number]  Take 5 mg by mouth daily as needed for moderate constipation. [provider]  Active Child, Self  carvedilol (COREG) 3.125 MG tablet [id number]  Take 1 tablet (3.125 mg total) by mouth 2 (two) times daily with a meal. [name], [name], DO  Active   losartan (COZAAR) 100 MG tablet [id number]  Take 100 mg by mouth daily. [provider]  Active Self, Child  mineral oil enema [id number]  Place 1 enema rectally once. [provider]  Active Child, Self  polyethylene glycol (MIRALAX / GLYCOLAX) 17 g packet [id number]  Take 17 g by mouth daily. [provider]  Consider Medication Status and Discontinue (Duplicate) Child, Self  polyethylene glycol (MIRALAX) 17 g packet [id number]  Take 17  g by mouth in the morning, at noon, and at bedtime. Continue using this medicine until your stools have become loose - then may descrease dosing until you have a normal stool without constipation [name], [name], PA-C  Active   traMADol (ULTRAM) 50 MG tablet [id number]  Take 50 mg by mouth daily as needed (Back pain).  [provider]  Active Self, Child  Wheat Dextrin (BENEFIBER PO) [id number]  Take 1 Scoop by mouth daily as needed (constipation). [provider]  Active Child, Self            Home Care and Equipment/Supplies: Were Home Health Services Ordered?: Yes Name of Home Health Agency:: [facility] Has Agency set up a time to come to your home?: No EMR reviewed for Home Health Orders: Orders present/patient has not received call (refer to CM for follow-up) Any new equipment or medical supplies ordered?: No  Functional Questionnaire: Do you need assistance with bathing/showering or dressing?: No Do you need assistance with meal preparation?: No Do you need assistance with eating?: No Do you have difficulty maintaining continence: No Do you need assistance with getting out of bed/getting out of a chair/moving?: No Do you have difficulty managing or taking your medications?: No  Follow up appointments reviewed: PCP Follow-up appointment confirmed?: No MD Provider Line Number:[phone] Given:  (The patient declines) Specialist Hospital Follow-up appointment confirmed?: Yes Date of Specialist follow-up appointment?: 07/19/23 Follow-Up Specialty Provider:: [facility] Do you need transportation to your follow-up appointment?: No Do you understand care options if your condition(s) worsen?: Yes-patient verbalized understanding  SDOH Interventions Today    Flowsheet Row Most Recent Value  SDOH Interventions   Food Insecurity Interventions Intervention Not  Indicated  Housing Interventions Intervention Not Indicated  Transportation Interventions Intervention Not  Indicated  Utilities Interventions Intervention Not Indicated      Interventions Today    Flowsheet Row Most Recent Value  General Interventions   General Interventions Discussed/Reviewed General Interventions Discussed       TOC Outreach completed today. The patient states he is doing well. Has a friend over today and did not feel the need to schedule a follow up phone call with the PCP. He does have an appointment with GI in January. The conversation was brief and he stated he is waiting on a call from the [facility] to come so he can have his vitals checked on  Deidre Ala, RN RN Care Manager [facility] [phone]

## 2023-07-11 DIAGNOSIS — K921 Melena: Secondary | ICD-10-CM | POA: Diagnosis not present

## 2023-07-11 DIAGNOSIS — Z7689 Persons encountering health services in other specified circumstances: Secondary | ICD-10-CM | POA: Diagnosis not present

## 2023-07-11 DIAGNOSIS — K5900 Constipation, unspecified: Secondary | ICD-10-CM | POA: Diagnosis not present

## 2023-07-11 DIAGNOSIS — I10 Essential (primary) hypertension: Secondary | ICD-10-CM | POA: Diagnosis not present

## 2023-07-11 DIAGNOSIS — I493 Ventricular premature depolarization: Secondary | ICD-10-CM | POA: Diagnosis not present

## 2023-07-17 ENCOUNTER — Inpatient Hospital Stay: Payer: PRIVATE HEALTH INSURANCE | Admitting: Gastroenterology

## 2023-07-17 ENCOUNTER — Telehealth: Payer: Self-pay | Admitting: Internal Medicine

## 2023-07-17 NOTE — Telephone Encounter (Signed)
 Patients daughter called to see if the patient needed to reschedule.  She said that he wasn't have any issues now and is going to the bathroom fine.  I told her that was entirely up to him on rescheduling.

## 2023-07-17 NOTE — Progress Notes (Deleted)
 GI Office Note    Referring Provider: Shona Norleen PEDLAR, MD Primary Care Physician:  Shona Norleen PEDLAR, MD  Primary Gastroenterologist: Carlin POUR. Cindie, DO   Chief Complaint   No chief complaint on file.   History of Present Illness   Dennis Gilbert is a 78 y.o. male presenting today for hospital follow up. Seen few weeks back with abdominal pain, constipation, rectal bleeding. CT with constipation, no obstruction. Rectal exam with pink-tinged mucoid residue, heme positive. No impaction. Patient was admitted due to bradycardia and dizziness/diaphoresis with standing.    Increasing constipation for one month. Using stool softeners, MOM, bisacodyl , Miralax  intermittently. Presented with some left sided abdominal pain. Noted blood in the stool after enema at home.  No pain associated with right inguinal hernia, mention of possible incarcerated or strangulation of hernia on CT but not consistent with exam findings.   While inpatient, he consumed golytely . Advised bowel regimen of benefiber and miralax  at discharge.   Patient evaluated by cardiology while inpatient due to frequent PVCs, but noted that dizziness resolved despite persistent PVCs. Plans for outpatient cardiac monitor but ok to proceed with colonoscopy per notes.   Colonoscopy 05/2020: -Preparation of colon was fair -nonbleeding internal hemorrhoids -diverticulosis -one 5mm polyp in transverse colon, sessile serrated polyp -repeat colonoscopy five years  Medications   Current Outpatient Medications  Medication Sig Dispense Refill   bisacodyl  (DULCOLAX) 5 MG EC tablet Take 5 mg by mouth daily as needed for moderate constipation.     carvedilol  (COREG ) 3.125 MG tablet Take 1 tablet (3.125 mg total) by mouth 2 (two) times daily with a meal. 60 tablet 2   losartan  (COZAAR ) 100 MG tablet Take 100 mg by mouth daily.     mineral oil enema Place 1 enema rectally once.     polyethylene glycol (MIRALAX  / GLYCOLAX ) 17 g packet Take  17 g by mouth daily.     polyethylene glycol (MIRALAX ) 17 g packet Take 17 g by mouth in the morning, at noon, and at bedtime. Continue using this medicine until your stools have become loose - then may descrease dosing until you have a normal stool without constipation 30 each 3   traMADol  (ULTRAM ) 50 MG tablet Take 50 mg by mouth daily as needed (Back pain).      Wheat Dextrin (BENEFIBER PO) Take 1 Scoop by mouth daily as needed (constipation).     No current facility-administered medications for this visit.    Allergies   Allergies as of 07/17/2023   (No Known Allergies)     Past Medical History   Past Medical History:  Diagnosis Date   Bulging lumbar disc    Chronic kidney disease (CKD)    HTN (hypertension)    Hyperlipidemia     Past Surgical History   Past Surgical History:  Procedure Laterality Date   BACK SURGERY     CHOLECYSTECTOMY     COLONOSCOPY WITH PROPOFOL  N/A 05/18/2020   Procedure: COLONOSCOPY WITH PROPOFOL ;  Surgeon: Cindie Carlin POUR, DO;  Location: [facility];  Service: Endoscopy;  Laterality: N/A;  11:45am   NECK SURGERY     POLYPECTOMY  05/18/2020   Procedure: POLYPECTOMY INTESTINAL;  Surgeon: Cindie Carlin POUR, DO;  Location: [facility];  Service: Endoscopy;;    Past Family History   Family History  Problem Relation Age of Onset   Lung cancer Mother 49   Heart attack Father 10   Colon cancer Neg Hx  Colon polyps Neg Hx     Past Social History   Social History   Socioeconomic History   Marital status: Legally Separated    Spouse name: Not on file   Number of children: Not on file   Years of education: Not on file   Highest education level: Not on file  Occupational History   Not on file  Tobacco Use   Smoking status: Never   Smokeless tobacco: Never  Substance and Sexual Activity   Alcohol use: Not Currently    Comment: no history of regular use   Drug use: Not Currently   Sexual activity: Not on file  Other Topics Concern    Not on file  Social History Narrative   Lives locally.  Active but does not routinely exercise.   Social Drivers of Corporate Investment Banker Strain: Not on file  Food Insecurity: No Food Insecurity (07/06/2023)   Hunger Vital Sign    Worried About Running Out of Food in the Last Year: Never true    Ran Out of Food in the Last Year: Never true  Transportation Needs: No Transportation Needs (07/06/2023)   PRAPARE - Administrator, Civil Service (Medical): No    Lack of Transportation (Non-Medical): No  Physical Activity: Not on file  Stress: Not on file  Social Connections: Not on file  Intimate Partner Violence: Not At Risk (07/06/2023)   Humiliation, Afraid, Rape, and Kick questionnaire    Fear of Current or Ex-Partner: No    Emotionally Abused: No    Physically Abused: No    Sexually Abused: No    Review of Systems   General: Negative for anorexia, weight loss, fever, chills, fatigue, weakness. ENT: Negative for hoarseness, difficulty swallowing , nasal congestion. CV: Negative for chest pain, angina, palpitations, dyspnea on exertion, peripheral edema.  Respiratory: Negative for dyspnea at rest, dyspnea on exertion, cough, sputum, wheezing.  GI: See history of present illness. GU:  Negative for dysuria, hematuria, urinary incontinence, urinary frequency, nocturnal urination.  Endo: Negative for unusual weight change.     Physical Exam   There were no vitals taken for this visit.   General: Well-nourished, well-developed in no acute distress.  Eyes: No icterus. Mouth: Oropharyngeal mucosa moist and pink , no lesions erythema or exudate. Lungs: Clear to auscultation bilaterally.  Heart: Regular rate and rhythm, no murmurs rubs or gallops.  Abdomen: Bowel sounds are normal, nontender, nondistended, no hepatosplenomegaly or masses,  no abdominal bruits or hernia , no rebound or guarding.  Rectal: ***  Extremities: No lower extremity edema. No clubbing or  deformities. Neuro: Alert and oriented x 4   Skin: Warm and dry, no jaundice.   Psych: Alert and cooperative, normal mood and affect.  Labs   Lab Results  Component Value Date   NA 139 07/04/2023   CL 104 07/04/2023   K 3.7 07/04/2023   CO2 26 07/04/2023   BUN 12 07/04/2023   CREATININE 1.15 07/04/2023   GFRNONAA >60 07/04/2023   CALCIUM 8.6 (L) 07/04/2023   PHOS 3.1 07/03/2023   ALBUMIN 3.7 07/03/2023   GLUCOSE 88 07/04/2023   Lab Results  Component Value Date   ALT 20 07/03/2023   AST 26 07/03/2023   ALKPHOS 49 07/03/2023   BILITOT 0.8 07/03/2023   Lab Results  Component Value Date   WBC 5.5 07/04/2023   HGB 12.2 (L) 07/04/2023   HCT 36.5 (L) 07/04/2023   MCV 100.8 (H) 07/04/2023  PLT 148 (L) 07/04/2023   Lab Results  Component Value Date   TSH 1.626 07/03/2023   Lab Results  Component Value Date   HGBA1C 5.6 07/03/2023    Imaging Studies   ECHOCARDIOGRAM COMPLETE Result Date: 07/03/2023    ECHOCARDIOGRAM REPORT   Patient Name:   DONOVYN GUIDICE Date of Exam: 07/03/2023 Medical Rec #:  [id number]     Height:       66.0 in Accession #:    [id number]    Weight:       135.0 lb Date of Birth:  January 06, 1946     BSA:          1.692 m Patient Age:    77 years      BP:           161/82 mmHg Patient Gender: M             HR:           61 bpm. Exam Location:  [facility] Procedure: 2D Echo, Cardiac Doppler and Color Doppler Indications:    Abnormal EKG R94.31  History:        Patient has no prior history of Echocardiogram examinations.                 Risk Factors:Hypertension.  Sonographer:    Aida Pizza RCS Referring Phys: [phone] CHRISTOPHER RONALD BERGE IMPRESSIONS  1. Left ventricular ejection fraction, by estimation, is 60 to 65%. The left ventricle has normal function. The left ventricle has no regional wall motion abnormalities. There is mild concentric left ventricular hypertrophy. Left ventricular diastolic parameters are consistent with Grade I diastolic dysfunction  (impaired relaxation).  2. Right ventricular systolic function is normal. The right ventricular size is normal. Tricuspid regurgitation signal is inadequate for assessing PA pressure.  3. Left atrial size was severely dilated.  4. The mitral valve is grossly normal. Mild mitral valve regurgitation.  5. The aortic valve is tricuspid. Aortic valve regurgitation is not visualized.  6. The inferior vena cava is normal in size with greater than 50% respiratory variability, suggesting right atrial pressure of 3 mmHg. Comparison(s): No prior Echocardiogram. FINDINGS  Left Ventricle: Left ventricular ejection fraction, by estimation, is 60 to 65%. The left ventricle has normal function. The left ventricle has no regional wall motion abnormalities. The left ventricular internal cavity size was normal in size. There is  mild concentric left ventricular hypertrophy. Left ventricular diastolic parameters are consistent with Grade I diastolic dysfunction (impaired relaxation). Right Ventricle: The right ventricular size is normal. No increase in right ventricular wall thickness. Right ventricular systolic function is normal. Tricuspid regurgitation signal is inadequate for assessing PA pressure. Left Atrium: Left atrial size was severely dilated. Right Atrium: Right atrial size was normal in size. Pericardium: There is no evidence of pericardial effusion. Mitral Valve: The mitral valve is grossly normal. Mild mitral valve regurgitation. Tricuspid Valve: The tricuspid valve is grossly normal. Tricuspid valve regurgitation is trivial. Aortic Valve: The aortic valve is tricuspid. There is mild aortic valve annular calcification. Aortic valve regurgitation is not visualized. Pulmonic Valve: The pulmonic valve was grossly normal. Pulmonic valve regurgitation is trivial. Aorta: The aortic root is normal in size and structure. Venous: The inferior vena cava is normal in size with greater than 50% respiratory variability, suggesting  right atrial pressure of 3 mmHg. IAS/Shunts: No atrial level shunt detected by color flow Doppler.  LEFT VENTRICLE PLAX 2D LVIDd:  4.60 cm   Diastology LVIDs:         3.00 cm   LV e' medial:    4.79 cm/s LV PW:         1.10 cm   LV E/e' medial:  14.4 LV IVS:        1.20 cm   LV e' lateral:   8.81 cm/s LVOT diam:     2.10 cm   LV E/e' lateral: 7.9 LV SV:         103 LV SV Index:   61 LVOT Area:     3.46 cm  RIGHT VENTRICLE RV S prime:     19.60 cm/s TAPSE (M-mode): 2.4 cm LEFT ATRIUM             Index        RIGHT ATRIUM           Index LA diam:        3.60 cm 2.13 cm/m   RA Area:     18.70 cm LA Vol (A2C):   92.2 ml 54.49 ml/m  RA Volume:   51.10 ml  30.20 ml/m LA Vol (A4C):   99.0 ml 58.50 ml/m LA Biplane Vol: 98.0 ml 57.91 ml/m  AORTIC VALVE LVOT Vmax:   134.00 cm/s LVOT Vmean:  87.200 cm/s LVOT VTI:    0.297 m  AORTA Ao Root diam: 3.40 cm MITRAL VALVE MV Area (PHT): 2.99 cm    SHUNTS MV Decel Time: 254 msec    Systemic VTI:  0.30 m MV E velocity: 69.20 cm/s  Systemic Diam: 2.10 cm MV A velocity: 64.80 cm/s MV E/A ratio:  1.07 Jayson Sierras MD Electronically signed by Jayson Sierras MD Signature Date/Time: 07/03/2023/3:04:20 PM    Final    CT Head Wo Contrast Result Date: 07/02/2023 CLINICAL DATA:  Syncope, dizziness EXAM: CT HEAD WITHOUT CONTRAST TECHNIQUE: Contiguous axial images were obtained from the base of the skull through the vertex without intravenous contrast. RADIATION DOSE REDUCTION: This exam was performed according to the departmental dose-optimization program which includes automated exposure control, adjustment of the mA and/or kV according to patient size and/or use of iterative reconstruction technique. COMPARISON:  08/18/2005 FINDINGS: Brain: There is atrophy and chronic small vessel disease changes. No acute intracranial abnormality. Specifically, no hemorrhage, hydrocephalus, mass lesion, acute infarction, or significant intracranial injury. Vascular: No hyperdense vessel  or unexpected calcification. Skull: No acute calvarial abnormality. Sinuses/Orbits: No acute findings Other: None IMPRESSION: Atrophy, chronic microvascular disease. No acute intracranial abnormality. Electronically Signed   By: Franky Crease M.D.   On: 07/02/2023 22:08   CT ABDOMEN PELVIS W CONTRAST Result Date: 07/02/2023 CLINICAL DATA:  Left lower quadrant abdominal pain. EXAM: CT ABDOMEN AND PELVIS WITH CONTRAST TECHNIQUE: Multidetector CT imaging of the abdomen and pelvis was performed using the standard protocol following bolus administration of intravenous contrast. RADIATION DOSE REDUCTION: This exam was performed according to the departmental dose-optimization program which includes automated exposure control, adjustment of the mA and/or kV according to patient size and/or use of iterative reconstruction technique. CONTRAST:  80mL OMNIPAQUE  IOHEXOL  300 MG/ML  SOLN COMPARISON:  None Available. FINDINGS: Lower chest: The visualized lung bases are clear. There is coronary vascular calcification. No intra-abdominal free air or free fluid. Hepatobiliary: The liver is unremarkable. There is mild biliary dilatation, post cholecystectomy. No retained calcified stone noted in the central CBD. Pancreas: Unremarkable. No pancreatic ductal dilatation or surrounding inflammatory changes. Spleen: Normal in size without focal abnormality. Adrenals/Urinary Tract:  The left adrenal gland is unremarkable. There is a 2 cm fatty lesion in the right adrenal gland, likely a myelolipoma. There is no hydronephrosis on either side. There is symmetric enhancement and excretion of contrast by both kidneys. The visualized ureters appear unremarkable. The urinary bladder is minimally distended and grossly unremarkable. Stomach/Bowel: There is large amount of stool throughout the colon. There is no bowel obstruction or active inflammation. The appendix is normal. Vascular/Lymphatic: Moderate aortoiliac atherosclerotic disease. The IVC  is unremarkable. No portal venous gas. There is no adenopathy. Reproductive: The prostate and seminal vesicles are grossly unremarkable. No pelvic mass. Other: Small fat containing right inguinal hernia. There is slight haziness of the herniated fat which may represent edema. Clinical correlation recommended to evaluate for possibility of strangulation or incarceration. No bowel herniation. Musculoskeletal: Degenerative changes of the spine and multiple spurring/osteophyte. No acute osseous pathology. IMPRESSION: 1. A fat containing right inguinal hernia with possible strangulation or incarceration. Clinical correlation is recommended. No other acute intra-abdominal or pelvic pathology. 2. Constipation.  No bowel obstruction. Normal appendix. 3.  Aortic Atherosclerosis (ICD10-I70.0). Electronically Signed   By: Vanetta Chou M.D.   On: 07/02/2023 19:53    Assessment       PLAN   ***   Sonny RAMAN. Ezzard, MHS, PA-C [facility]

## 2023-07-19 ENCOUNTER — Ambulatory Visit: Payer: PRIVATE HEALTH INSURANCE | Admitting: Gastroenterology

## 2023-07-21 DIAGNOSIS — Z0130 Encounter for examination of blood pressure without abnormal findings: Secondary | ICD-10-CM | POA: Diagnosis not present

## 2023-07-26 DIAGNOSIS — I493 Ventricular premature depolarization: Secondary | ICD-10-CM | POA: Diagnosis not present

## 2023-07-31 ENCOUNTER — Telehealth: Payer: Self-pay

## 2023-07-31 DIAGNOSIS — I493 Ventricular premature depolarization: Secondary | ICD-10-CM

## 2023-07-31 NOTE — Telephone Encounter (Signed)
-----   Message from Laurann Montana sent at 07/28/2023  7:44 AM EST ----- Patient to have fairly high PVC burden as well as few episodes of SVT. Average HR was 55 so do not want to acutely increase his beta blocker. Recommend referral to EP for frequent PVCs, keep f/u with me as planned. Notify for any new concerns in the interim.

## 2023-07-31 NOTE — Telephone Encounter (Signed)
Spoke to pt/pt's daughter who verbalized understanding. Pt/daughter had no questions or concerns at this time.

## 2023-08-03 NOTE — Progress Notes (Signed)
Cardiology Office Note    Date:  08/07/2023  ID:  Dennis Gilbert, Dennis Gilbert 1945-11-10, MRN [id number] PCP:  Benita Stabile, MD  Cardiologist:  Nona Dell, MD  Electrophysiologist:  None   Chief Complaint: f/u PVCs  History of Present Illness: .    Dennis Gilbert is a 78 y.o. male with visit-pertinent history of HTN, HLD, CKD 3a, recently diagnosed PVCs, mild MR seen for follow-up. He was recently admitted to [facility] 06/2023 with worsening abdominal pain, anorexia, dizziness, BRBPR superimposed on a month of constipation and abdominal bloating. Found to have low grade temp and hypertensive with + FOBT on arrival. CT showed constipation, no bowel obstruction, fat containing right inguinal hernia with possible strangulation or incarceration. Symptoms felt due to severe constipation. He was treated with bowel cleanout with improvement in symptoms and plan for OP GI follow-up. Cardiology was consulted for frequent PVCs, asymptomatic. 2D echo showed EF 60-65%, G1DD, severely dilated LA, mild MR.  He was started on carvedilol as BP was also high. Outpatient monitor showed avg HR 55bpm, range 42->98 bpm, 2% PACs, 17.2% PVCs, 18 brief episodes of PSVT (longest 18 beats). Referral placed to EP.  He is seen back for follow-up doing fantastic. He is here with his daughter. He reports complete resolution of all GI symptoms since he left the hospital. He has absolutely no cardiac symptoms to speak of. No CP, SOB, palpitations, near-syncope, syncope or dizziness. He spent the summer redoing an entire front porch on his house and now likes to work on cars in his shop. He would prefer a conservative approach to his care. He plans to f/u with his PCP in a few weeks as well.   Labwork independently reviewed: 06/2023 Hgb 12.2, plt 148, Mg 2.0, K 3.7, Cr 1.15, calcium 8.6, TSH wnl, LFTs ok, A1c 5.6  ROS: .    Please see the history of present illness.  All other systems are reviewed and otherwise negative.  Studies  Reviewed: Marland Kitchen    EKG:  EKG is not ordered today  CV Studies: Cardiac studies reviewed are outlined and summarized above. Otherwise please see EMR for full report.   Current Reported Medications:.    Current Meds  Medication Sig   carvedilol (COREG) 3.125 MG tablet Take 1 tablet (3.125 mg total) by mouth 2 (two) times daily with a meal.   losartan (COZAAR) 100 MG tablet Take 100 mg by mouth daily.   traMADol (ULTRAM) 50 MG tablet Take 50 mg by mouth daily as needed (Back pain).    Wheat Dextrin (BENEFIBER PO) Take 1 Scoop by mouth daily as needed (constipation).    Physical Exam:    VS:  BP 120/80 (BP Location: Left Arm, Patient Position: Sitting, Cuff Size: Normal)   Pulse (!) 50   Ht 5\' 6"  (1.676 m)   Wt 132 lb 9.6 oz (60.1 kg)   SpO2 95%   BMI 21.40 kg/m    Wt Readings from Last 3 Encounters:  08/07/23 132 lb 9.6 oz (60.1 kg)  07/02/23 135 lb (61.2 kg)  05/18/20 132 lb (59.9 kg)    GEN: Well nourished, well developed in no acute distress NECK: No JVD; No carotid bruits CARDIAC: RRR with occasional ectopy, no murmurs, rubs, gallops RESPIRATORY:  Clear to auscultation without rales, wheezing or rhonchi  ABDOMEN: Soft, non-tender, non-distended EXTREMITIES:  No edema; No acute deformity   Asessement and Plan:.    1. Frequent PVCs, occasional PACs, brief PSVT - he reports  he is completely asymptomatic and feeling fine on current regimen. We discussed nuclear stress test to exclude CAD as a contributing factor and referral to EP given 17% burden on monitor despite beta blocker. He politely declines either and prefers to hold off on any further testing or management at this time. Will cancel EP referral. Would not titrate carvedilol further at this time given baseline sinus bradycardia (avg HR 55bpm by monitor). Recent TSH, Mg OK. His last K was 3.7 but in the setting of bowel meds; prior to that it was >4.0. He reports he plans to have repeat labs with PCP and does not want recheck  today. He promises to notify if he begins to develop any new cardiac symptoms.  2. HTN - BP controlled today on present regimen, would continue losartan 100mg  daily and carvedilol 3.125mg  BID. Refill carvedilol to [facility] today.  3. Mild MR - consider repeat echo in 3-5 years or sooner if clinically indicated.  4. Macrocytic anemia - encouraged f/u PCP.    Disposition: F/u with Dr. Diona Browner in 6 months.  Signed, Laurann Montana, PA-C

## 2023-08-07 ENCOUNTER — Ambulatory Visit: Payer: Medicare Other | Attending: Physician Assistant | Admitting: Physician Assistant

## 2023-08-07 ENCOUNTER — Encounter: Payer: Self-pay | Admitting: Physician Assistant

## 2023-08-07 VITALS — BP 120/80 | HR 50 | Ht 66.0 in | Wt 132.6 lb

## 2023-08-07 DIAGNOSIS — I493 Ventricular premature depolarization: Secondary | ICD-10-CM | POA: Diagnosis not present

## 2023-08-07 DIAGNOSIS — I471 Supraventricular tachycardia, unspecified: Secondary | ICD-10-CM | POA: Insufficient documentation

## 2023-08-07 DIAGNOSIS — I340 Nonrheumatic mitral (valve) insufficiency: Secondary | ICD-10-CM | POA: Diagnosis not present

## 2023-08-07 DIAGNOSIS — I491 Atrial premature depolarization: Secondary | ICD-10-CM | POA: Diagnosis not present

## 2023-08-07 DIAGNOSIS — D539 Nutritional anemia, unspecified: Secondary | ICD-10-CM | POA: Diagnosis not present

## 2023-08-07 DIAGNOSIS — I10 Essential (primary) hypertension: Secondary | ICD-10-CM | POA: Diagnosis not present

## 2023-08-07 MED ORDER — CARVEDILOL 3.125 MG PO TABS: 3.1250 mg | ORAL_TABLET | Freq: Two times a day (BID) | ORAL | 3 refills | Status: AC

## 2023-08-07 NOTE — Patient Instructions (Signed)
Medication Instructions:  Your physician recommends that you continue on your current medications as directed. Please refer to the Current Medication list given to you today.  *If you need a refill on your cardiac medications before your next appointment, please call your pharmacy*   Lab Work: NONE   If you have labs (blood work) drawn today and your tests are completely normal, you will receive your results only by: MyChart Message (if you have MyChart) OR A paper copy in the mail If you have any lab test that is abnormal or we need to change your treatment, we will call you to review the results.   Testing/Procedures: NONE    Follow-Up: At [facility], you and your health needs are our priority.  As part of our continuing mission to provide you with exceptional heart care, we have created designated Provider Care Teams.  These Care Teams include your primary Cardiologist (physician) and Advanced Practice Providers (APPs -  Physician Assistants and Nurse Practitioners) who all work together to provide you with the care you need, when you need it.  We recommend signing up for the patient portal called "MyChart".  Sign up information is provided on this After Visit Summary.  MyChart is used to connect with patients for Virtual Visits (Telemedicine).  Patients are able to view lab/test results, encounter notes, upcoming appointments, etc.  Non-urgent messages can be sent to your provider as well.   To learn more about what you can do with MyChart, go to [email or web address].    Your next appointment:   6 month(s)  Provider:   You may see Nona Dell, MD or one of the following Advanced Practice Providers on your designated Care Team:   Randall An, PA-C  Jacolyn Reedy, PA-C     Other Instructions Thank you for choosing [facility]!

## 2023-08-14 ENCOUNTER — Telehealth: Payer: Self-pay | Admitting: Cardiology

## 2023-08-14 NOTE — Telephone Encounter (Signed)
Daughter states they got a letter in the mail stating patient needed monitor ?? She does not have letter with her.  He does not need a monitor as he just wore one. They have again declined EP referral, referral cancelled.

## 2023-08-14 NOTE — Telephone Encounter (Signed)
Patient's daughter would like a call back to discuss monitor order.

## 2023-08-14 NOTE — Addendum Note (Signed)
Addended by: Roseanne Reno on: 08/14/2023 09:51 AM   Modules accepted: Orders

## 2023-08-21 DIAGNOSIS — D509 Iron deficiency anemia, unspecified: Secondary | ICD-10-CM | POA: Diagnosis not present

## 2023-08-21 DIAGNOSIS — Z125 Encounter for screening for malignant neoplasm of prostate: Secondary | ICD-10-CM | POA: Diagnosis not present

## 2023-08-21 DIAGNOSIS — R7303 Prediabetes: Secondary | ICD-10-CM | POA: Diagnosis not present

## 2023-08-21 DIAGNOSIS — E782 Mixed hyperlipidemia: Secondary | ICD-10-CM | POA: Diagnosis not present

## 2023-08-25 DIAGNOSIS — K4090 Unilateral inguinal hernia, without obstruction or gangrene, not specified as recurrent: Secondary | ICD-10-CM | POA: Diagnosis not present

## 2023-08-25 DIAGNOSIS — N1831 Chronic kidney disease, stage 3a: Secondary | ICD-10-CM | POA: Diagnosis not present

## 2023-08-25 DIAGNOSIS — D509 Iron deficiency anemia, unspecified: Secondary | ICD-10-CM | POA: Diagnosis not present

## 2023-08-25 DIAGNOSIS — R7303 Prediabetes: Secondary | ICD-10-CM | POA: Diagnosis not present

## 2023-08-25 DIAGNOSIS — E875 Hyperkalemia: Secondary | ICD-10-CM | POA: Diagnosis not present

## 2023-08-25 DIAGNOSIS — Z860100 Personal history of colon polyps, unspecified: Secondary | ICD-10-CM | POA: Diagnosis not present

## 2023-08-25 DIAGNOSIS — I10 Essential (primary) hypertension: Secondary | ICD-10-CM | POA: Diagnosis not present

## 2023-08-25 DIAGNOSIS — M545 Low back pain, unspecified: Secondary | ICD-10-CM | POA: Diagnosis not present

## 2023-08-25 DIAGNOSIS — I129 Hypertensive chronic kidney disease with stage 1 through stage 4 chronic kidney disease, or unspecified chronic kidney disease: Secondary | ICD-10-CM | POA: Diagnosis not present

## 2023-08-25 DIAGNOSIS — E782 Mixed hyperlipidemia: Secondary | ICD-10-CM | POA: Diagnosis not present

## 2023-08-25 DIAGNOSIS — G4700 Insomnia, unspecified: Secondary | ICD-10-CM | POA: Diagnosis not present

## 2023-08-25 DIAGNOSIS — D696 Thrombocytopenia, unspecified: Secondary | ICD-10-CM | POA: Diagnosis not present

## 2023-10-05 DIAGNOSIS — I10 Essential (primary) hypertension: Secondary | ICD-10-CM | POA: Diagnosis not present

## 2023-10-05 DIAGNOSIS — I493 Ventricular premature depolarization: Secondary | ICD-10-CM | POA: Diagnosis not present

## 2023-10-19 DIAGNOSIS — I10 Essential (primary) hypertension: Secondary | ICD-10-CM | POA: Diagnosis not present

## 2023-10-19 DIAGNOSIS — Z79899 Other long term (current) drug therapy: Secondary | ICD-10-CM | POA: Diagnosis not present

## 2023-10-19 DIAGNOSIS — Z6821 Body mass index (BMI) 21.0-21.9, adult: Secondary | ICD-10-CM | POA: Diagnosis not present

## 2023-10-19 DIAGNOSIS — Z713 Dietary counseling and surveillance: Secondary | ICD-10-CM | POA: Diagnosis not present

## 2023-12-26 DIAGNOSIS — H53021 Refractive amblyopia, right eye: Secondary | ICD-10-CM | POA: Diagnosis not present

## 2023-12-26 DIAGNOSIS — H2513 Age-related nuclear cataract, bilateral: Secondary | ICD-10-CM | POA: Diagnosis not present

## 2023-12-26 DIAGNOSIS — H2511 Age-related nuclear cataract, right eye: Secondary | ICD-10-CM | POA: Diagnosis not present

## 2023-12-26 DIAGNOSIS — H16223 Keratoconjunctivitis sicca, not specified as Sjogren's, bilateral: Secondary | ICD-10-CM | POA: Diagnosis not present

## 2024-01-03 ENCOUNTER — Encounter (HOSPITAL_COMMUNITY): Payer: Self-pay

## 2024-01-03 ENCOUNTER — Encounter (HOSPITAL_COMMUNITY)
Admission: RE | Admit: 2024-01-03 | Discharge: 2024-01-03 | Disposition: A | Discharge status: Home / Self Care | Source: Ambulatory Visit | Attending: Optometry | Admitting: Optometry

## 2024-01-03 ENCOUNTER — Other Ambulatory Visit: Payer: Self-pay

## 2024-01-03 NOTE — H&P (Signed)
 Surgical History & Physical  Patient Name: Dennis Gilbert  DOB: 1946-02-23  Surgery: Cataract extraction with intraocular lens implant phacoemulsification; Right Eye Surgeon: Marsa Cleverly MD Surgery Date: 01/05/2024 Pre-Op Date: 12/26/2023  HPI: A 11 Yr. old male patient here today for an initial medical exam to have a cataract evaluation. Pt states vision has decreased both distance and near OU, more so OD. Pt complains of difficulty reading reading small print on books, bottles, and labels, difficulty with glare on bright sunny days, poor night vision, and overall hazy, blurry vision OU. Pt is uncomfortable driving lately due to decreased vision and would like to improve overall visual acuity. Pt uses Visine PRN if OU feel dry or irritated. HPI was performed by Marsa Cleverly .  Medical History: Cataracts Amblyopia OD  Heart Problem High Blood Pressure All recorded systems are negative except as noted above.  Social Never smoked  Medication Visine,  Losartan   Sx/Procedures Gallbladder sugery, Ruptured disc in back, Bone spurs in neck  Drug Allergies  NKDA  History & Physical: Heent: cataracts NECK: supple without bruits LUNGS: lungs clear to auscultation CV: regular rate and rhythm Abdomen: soft and non-tender  Impression & Plan: Assessment: 1.  CATARACT NUCLEAR SCLEROSIS AGE RELATED; Both Eyes (H25.13) 2.  KERATOCONJUNCTIVITIS SICCA NOT SPECIFIED AS SJORGRENS; Both Eyes (H16.223) 3.  AMBLYOPIA REFRACTIVE; Right Eye (H53.021)  Plan: 1.  Cataracts are visually significant and account for the patient's complaints. Discussed all risks, benefits, procedures and recovery, including infection, loss of vision and eye, need for glasses after surgery or additional procedures. Patient understands changing glasses will not improve vision. Patient indicated understanding of procedure. All questions answered. Patient desires to have surgery, recommend phacoemulsification with  intraocular lens. Patient to have preliminary testing necessary (Argos/IOL Master, Mac OCT, TOPO) Educational materials provided:Cataract.  Plan: - Proceed with cataract surgery OD, followed by OS - Plan for best distance target with DIB00 - No DM, no fuchs, no prior eye surgery - good dilation - Dextenza if available  2.  Dry eye. Mild signs of dry eye at this time. Can use artificial tears QID OU PRN and warm compresses once daily as needed.  3.  Stable

## 2024-01-05 ENCOUNTER — Ambulatory Visit (HOSPITAL_COMMUNITY)
Admission: RE | Admit: 2024-01-05 | Discharge: 2024-01-05 | Disposition: A | Discharge status: Home / Self Care | Attending: Optometry | Admitting: Optometry

## 2024-01-05 ENCOUNTER — Encounter (HOSPITAL_COMMUNITY): Payer: Self-pay | Admitting: Optometry

## 2024-01-05 ENCOUNTER — Ambulatory Visit (HOSPITAL_COMMUNITY): Payer: Self-pay | Admitting: Anesthesiology

## 2024-01-05 ENCOUNTER — Ambulatory Visit (HOSPITAL_BASED_OUTPATIENT_CLINIC_OR_DEPARTMENT_OTHER): Payer: Self-pay | Admitting: Anesthesiology

## 2024-01-05 ENCOUNTER — Encounter (HOSPITAL_COMMUNITY)
Admission: RE | Disposition: A | Discharge status: Home / Self Care | Payer: Self-pay | Source: Home / Self Care | Attending: Optometry

## 2024-01-05 DIAGNOSIS — I10 Essential (primary) hypertension: Secondary | ICD-10-CM

## 2024-01-05 DIAGNOSIS — H5711 Ocular pain, right eye: Secondary | ICD-10-CM | POA: Diagnosis not present

## 2024-01-05 DIAGNOSIS — N189 Chronic kidney disease, unspecified: Secondary | ICD-10-CM | POA: Diagnosis not present

## 2024-01-05 DIAGNOSIS — I129 Hypertensive chronic kidney disease with stage 1 through stage 4 chronic kidney disease, or unspecified chronic kidney disease: Secondary | ICD-10-CM | POA: Insufficient documentation

## 2024-01-05 DIAGNOSIS — H2511 Age-related nuclear cataract, right eye: Secondary | ICD-10-CM | POA: Diagnosis not present

## 2024-01-05 HISTORY — PX: INSERTION, STENT, DRUG-ELUTING, LACRIMAL CANALICULUS: SHX7453

## 2024-01-05 HISTORY — PX: CATARACT EXTRACTION W/PHACO: SHX586

## 2024-01-05 SURGERY — PHACOEMULSIFICATION, CATARACT, WITH IOL INSERTION
Anesthesia: Monitor Anesthesia Care | Site: Eye | Laterality: Right

## 2024-01-05 MED ORDER — MIDAZOLAM HCL 2 MG/2ML IJ SOLN
INTRAMUSCULAR | Status: DC | PRN
Start: 2024-01-05 — End: 2024-01-05
  Administered 2024-01-05: .5 mg via INTRAVENOUS

## 2024-01-05 MED ORDER — SIGHTPATH DOSE#1 NA HYALUR & NA CHOND-NA HYALUR IO KIT
PACK | INTRAOCULAR | Status: DC | PRN
  Administered 2024-01-05: 1 via OPHTHALMIC

## 2024-01-05 MED ORDER — DEXAMETHASONE 0.4 MG OP INST
VAGINAL_INSERT | OPHTHALMIC | Status: AC
  Filled 2024-01-05: qty 1

## 2024-01-05 MED ORDER — PHENYLEPHRINE HCL 2.5 % OP SOLN
1.0000 [drp] | OPHTHALMIC | Status: AC
  Administered 2024-01-05 (×3): 1 [drp] via OPHTHALMIC

## 2024-01-05 MED ORDER — LIDOCAINE HCL (PF) 1 % IJ SOLN
INTRAMUSCULAR | Status: DC | PRN
  Administered 2024-01-05: 1 mL

## 2024-01-05 MED ORDER — HYDRALAZINE HCL 20 MG/ML IJ SOLN
INTRAMUSCULAR | Status: DC | PRN
Start: 2024-01-05 — End: 2024-01-05
  Administered 2024-01-05 (×2): 5 mg via INTRAVENOUS

## 2024-01-05 MED ORDER — TETRACAINE 0.5 % OP SOLN OPTIME - NO CHARGE
OPHTHALMIC | Status: DC | PRN
  Administered 2024-01-05: 1 [drp] via OPHTHALMIC

## 2024-01-05 MED ORDER — STERILE WATER FOR IRRIGATION IR SOLN
Status: DC | PRN
Start: 2024-01-05 — End: 2024-01-05
  Administered 2024-01-05: 250 mL

## 2024-01-05 MED ORDER — SODIUM CHLORIDE 0.9% FLUSH
INTRAVENOUS | Status: DC | PRN
Start: 2024-01-05 — End: 2024-01-05
  Administered 2024-01-05 (×3): 5 mL via INTRAVENOUS

## 2024-01-05 MED ORDER — MOXIFLOXACIN HCL 5 MG/ML IO SOLN
INTRAOCULAR | Status: DC | PRN
  Administered 2024-01-05: .2 mL via INTRACAMERAL

## 2024-01-05 MED ORDER — PHENYLEPHRINE-KETOROLAC 1-0.3 % IO SOLN
INTRAOCULAR | Status: DC | PRN
  Administered 2024-01-05: 500 mL via OPHTHALMIC

## 2024-01-05 MED ORDER — POVIDONE-IODINE 5 % OP SOLN
OPHTHALMIC | Status: DC | PRN
  Administered 2024-01-05: 1 via OPHTHALMIC

## 2024-01-05 MED ORDER — DEXAMETHASONE 0.4 MG OP INST
VAGINAL_INSERT | OPHTHALMIC | Status: DC | PRN
  Administered 2024-01-05: .4 mg via OPHTHALMIC

## 2024-01-05 MED ORDER — LACTATED RINGERS IV SOLN: INTRAVENOUS | Status: DC

## 2024-01-05 MED ORDER — BSS IO SOLN
INTRAOCULAR | Status: DC | PRN
  Administered 2024-01-05: 15 mL via INTRAOCULAR

## 2024-01-05 MED ORDER — TETRACAINE HCL 0.5 % OP SOLN
1.0000 [drp] | OPHTHALMIC | Status: AC
  Administered 2024-01-05 (×3): 1 [drp] via OPHTHALMIC

## 2024-01-05 MED ORDER — LIDOCAINE HCL 3.5 % OP GEL
1.0000 | Freq: Once | OPHTHALMIC | Status: AC
Start: 2024-01-05 — End: 2024-01-05
  Administered 2024-01-05: 1 via OPHTHALMIC

## 2024-01-05 MED ORDER — MIDAZOLAM HCL 2 MG/2ML IJ SOLN
INTRAMUSCULAR | Status: AC
Start: 2024-01-05 — End: 2024-01-05
  Filled 2024-01-05: qty 2

## 2024-01-05 MED ORDER — HYDRALAZINE HCL 20 MG/ML IJ SOLN
INTRAMUSCULAR | Status: AC
  Filled 2024-01-05: qty 1

## 2024-01-05 MED ORDER — TROPICAMIDE 1 % OP SOLN
1.0000 [drp] | OPHTHALMIC | Status: AC
  Administered 2024-01-05 (×3): 1 [drp] via OPHTHALMIC

## 2024-01-05 SURGICAL SUPPLY — 14 items
CATARACT SUITE SIGHTPATH (MISCELLANEOUS) ×2 IMPLANT
CLOTH BEACON ORANGE TIMEOUT ST (SAFETY) ×2 IMPLANT
DRSG TEGADERM 4X4.75 (GAUZE/BANDAGES/DRESSINGS) ×2 IMPLANT
EYE SHIELD UNIVERSAL CLEAR (GAUZE/BANDAGES/DRESSINGS) IMPLANT
FEE CATARACT SUITE SIGHTPATH (MISCELLANEOUS) ×2 IMPLANT
GLOVE BIOGEL PI IND STRL 7.0 (GLOVE) ×4 IMPLANT
LENS IOL TECNIS EYHANCE 18.0 (Intraocular Lens) IMPLANT
NDL HYPO 18GX1.5 BLUNT FILL (NEEDLE) ×2 IMPLANT
NEEDLE HYPO 18GX1.5 BLUNT FILL (NEEDLE) ×2 IMPLANT
PAD ARMBOARD POSITIONER FOAM (MISCELLANEOUS) ×2 IMPLANT
POSITIONER HEAD 8X9X4 ADT (SOFTGOODS) ×2 IMPLANT
SYR TB 1ML LL NO SAFETY (SYRINGE) ×2 IMPLANT
TAPE SURG TRANSPORE 1 IN (GAUZE/BANDAGES/DRESSINGS) IMPLANT
WATER STERILE IRR 250ML POUR (IV SOLUTION) ×2 IMPLANT

## 2024-01-05 NOTE — Discharge Instructions (Signed)
 Please discharge patient when stable, will follow up today with Dr. Ilsa Iha at the [facility] office immediately following discharge.  Leave shield in place until visit.  All paperwork with discharge instructions will be given at the office.  [facility] Address:  [address]  [place], [place] [address]  Dr. Chaya Jan Phone: [phone]

## 2024-01-05 NOTE — Anesthesia Preprocedure Evaluation (Signed)
 Anesthesia Evaluation  Patient identified by MRN, date of birth, ID band Patient awake    Reviewed: Allergy & Precautions, H&P , NPO status , Patient's Chart, lab work & pertinent test results, reviewed documented beta blocker date and time   Airway Mallampati: II  TM Distance: >3 FB Neck ROM: full    Dental no notable dental hx.    Pulmonary neg pulmonary ROS   Pulmonary exam normal breath sounds clear to auscultation       Cardiovascular Exercise Tolerance: Good hypertension,  Rhythm:regular Rate:Normal     Neuro/Psych negative neurological ROS  negative psych ROS   GI/Hepatic negative GI ROS, Neg liver ROS,,,  Endo/Other  negative endocrine ROS    Renal/GU Renal disease  negative genitourinary   Musculoskeletal   Abdominal   Peds  Hematology negative hematology ROS (+)   Anesthesia Other Findings   Reproductive/Obstetrics negative OB ROS                             Anesthesia Physical Anesthesia Plan  ASA: 2  Anesthesia Plan: MAC   Post-op Pain Management:    Induction:   PONV Risk Score and Plan:   Airway Management Planned:   Additional Equipment:   Intra-op Plan:   Post-operative Plan:   Informed Consent: I have reviewed the patients History and Physical, chart, labs and discussed the procedure including the risks, benefits and alternatives for the proposed anesthesia with the patient or authorized representative who has indicated his/her understanding and acceptance.     Dental Advisory Given  Plan Discussed with: CRNA  Anesthesia Plan Comments:        Anesthesia Quick Evaluation

## 2024-01-05 NOTE — Op Note (Signed)
 Date of procedure: 01/05/24  Pre-operative diagnosis: Visually significant age-related nuclear cataract, Right Eye (H25.11)  Post-operative diagnosis: Visually significant age-related nuclear cataract, Right Eye H25.11; Ocular Pain and Inflammation, Right eye H57.11  Procedure: Removal of cataract via phacoemulsification and insertion of intra-ocular lens J&J DIBOO +18.0D into the capsular bag of the Right Eye, Dextenza Implantation into right lower punctum CPT [phone]  Attending surgeon: Marsa Cleverly, MD  Anesthesia: MAC, Topical Akten  Complications: None  Estimated Blood Loss: <48mL (minimal)  Specimens: None  Implants:  Implant Name Type Inv. Item Serial No. Manufacturer Lot No. LRB No. Used Action  LENS IOL TECNIS EYHANCE 18.0 - [id number] Intraocular Lens LENS IOL TECNIS EYHANCE 18.0 [id number] SIGHTPATH  Right 1 Implanted    Indications:  Visually significant age-related cataract, Right Eye  Procedure:  The patient was seen and identified in the pre-operative area. The operative eye was identified and dilated.  The operative eye was marked.  Topical anesthesia was administered to the operative eye.     The patient was then to the operative suite and placed in the supine position.  A timeout was performed confirming the patient, procedure to be performed, and all other relevant information.   The patient's face was prepped and draped in the usual fashion for intra-ocular surgery.  A lid speculum was placed into the operative eye and the surgical microscope moved into place and focused.  A superotemporal paracentesis was created using a 20 gauge paracentesis blade.  BSS mixed with Omidria, followed by 1% lidocaine was injected into the anterior chamber.  Viscoelastic was injected into the anterior chamber.  A temporal clear-corneal main wound incision was created using a 2.63mm microkeratome.  A continuous curvilinear capsulorrhexis was initiated using an irrigating cystitome and  completed using capsulorrhexis forceps.  Hydrodissection and hydrodeliniation were performed.  Viscoelastic was injected into the anterior chamber.  A phacoemulsification handpiece and a chopper as a second instrument were used to remove the nucleus and epinucleus. The irrigation/aspiration handpiece was used to remove any remaining cortical material.   The capsular bag was reinflated with viscoelastic, checked, and found to be intact.  The intraocular lens was inserted into the capsular bag.  The irrigation/aspiration handpiece was used to remove any remaining viscoelastic.  The clear corneal wound and paracentesis wounds were then hydrated and checked with Weck-Cels to be watertight. Moxifloxacin was instilled into the anterior chamber.  The lid-speculum and drape were removed. The lower punctum was dilated, and the dextenza implant was inserted into it. The patient's face was cleaned with a wet and dry 4x4. A clear shield was taped over the eye. The patient was taken to the post-operative care unit in good condition, having tolerated the procedure well.  Post-Op Instructions: The patient will follow up at [facility] for a same day post-operative evaluation and will receive all other orders and instructions.

## 2024-01-05 NOTE — Anesthesia Postprocedure Evaluation (Signed)
 Anesthesia Post Note  Patient: BRYSYN BRANDENBERGER  Procedure(s) Performed: PHACOEMULSIFICATION, CATARACT, WITH IOL INSERTION (Right: Eye) INSERTION, STENT, DRUG-ELUTING, LACRIMAL CANALICULUS (Right)  Patient location during evaluation: Phase II Anesthesia Type: MAC Level of consciousness: awake Pain management: pain level controlled Vital Signs Assessment: post-procedure vital signs reviewed and stable Respiratory status: spontaneous breathing and respiratory function stable Cardiovascular status: blood pressure returned to baseline and stable Postop Assessment: no headache and no apparent nausea or vomiting Anesthetic complications: no Comments: Late entry   No notable events documented.   Last Vitals:  Vitals:   01/05/24 0802 01/05/24 0929  BP: (!) 178/74 (!) 148/66  Pulse:  (!) 56  Resp: 14 16  Temp: 36.8 C 37.1 C  SpO2: 99% 100%    Last Pain:  Vitals:   01/05/24 0929  TempSrc: Oral  PainSc: 0-No pain                 Yvonna JINNY Bosworth

## 2024-01-05 NOTE — Transfer of Care (Signed)
 Immediate Anesthesia Transfer of Care Note  Patient: Dennis Gilbert  Procedure(s) Performed: PHACOEMULSIFICATION, CATARACT, WITH IOL INSERTION (Right: Eye) INSERTION, STENT, DRUG-ELUTING, LACRIMAL CANALICULUS (Right)  Patient Location: Short Stay  Anesthesia Type:MAC  Level of Consciousness: awake, alert , oriented, and patient cooperative  Airway & Oxygen Therapy: Patient Spontanous Breathing  Post-op Assessment: Report given to RN and Post -op Vital signs reviewed and stable  Post vital signs: Reviewed and stable  Last Vitals:  Vitals Value Taken Time  BP 148/66 01/05/24 09:31  Temp    Pulse 56 01/05/24 09:31  Resp 15 01/05/24 09:31  SpO2 100% on RA 01/05/24 09:31    Last Pain:  Vitals:   01/05/24 0801  PainSc: 0-No pain         Complications: No notable events documented.

## 2024-01-05 NOTE — Interval H&P Note (Signed)
 History and Physical Interval Note:  01/05/2024 8:19 AM  The H and P was reviewed and updated. The patient was examined.  No changes were found after exam.  The surgical eye was marked.   Dennis Gilbert

## 2024-01-08 ENCOUNTER — Encounter (HOSPITAL_COMMUNITY): Payer: Self-pay | Admitting: Optometry

## 2024-01-16 NOTE — H&P (Signed)
 Surgical History & Physical  Patient Name: Dennis Gilbert  DOB: [birth year]  Surgery: Cataract extraction with intraocular lens implant phacoemulsification; Left Eye Surgeon: Marsa Cleverly MD Surgery Date: 01/23/2024 Pre-Op Date: 01/09/2024  HPI: A 49 Yr. old male patient 1. The patient is returning after cataract surgery. The right eye is affected. Status post cataract surgery, which began 4 days ago: Since the last visit, the affected area feels improvement. The patient's vision is improved. The condition's severity is constant. Patient is following medication instructions. 2. 2. The patient is returning for a cataract follow-up of the left eye. Since the last visit, the affected area is tolerating. The patient's vision is blurry. The condition's severity is constant. Patient is not taking medications. This is negatively affecting the patient's quality of life and the patient is unable to function adequately in life with the current level of vision.  Medical History: Cataracts Amblyopia OD  Heart Problem High Blood Pressure  Review of Systems Cardiovascular High Blood Pressure, Irregular Heart beat Eyes cataracts All recorded systems are negative except as noted above.  Social Never smoked   Medication Visine, Prednisolone-moxiflox-bromfen,  Losartan   Sx/Procedures Phaco c IOL OD-dextenza ,  Gallbladder sugery, Ruptured disc in back, Bone spurs in neck  Drug Allergies  NKDA  History & Physical: Heent: cataract NECK: supple without bruits LUNGS: lungs clear to auscultation CV: regular rate and rhythm Abdomen: soft and non-tender  Impression & Plan: Assessment: 1.  CATARACT EXTRACTION STATUS; Right Eye (Z98.41) 2.  INTRAOCULAR LENS IOL (Z96.1) 3.  CATARACT NUCLEAR SCLEROSIS AGE RELATED; Both Eyes (H25.13)  Plan: 1.  POD4. Doing well. All post-op precautions discussed and instructions reviewed. Written instructions given  2.   3.  Cataracts are visually significant  and account for the patient's complaints. Discussed all risks, benefits, procedures and recovery, including infection, loss of vision and eye, need for glasses after surgery or additional procedures. Patient understands changing glasses will not improve vision. Patient indicated understanding of procedure. All questions answered. Patient desires to have surgery, recommend phacoemulsification with intraocular lens. Patient to have preliminary testing necessary (Argos/IOL Master, Mac OCT, TOPO) Educational materials provided:Cataract.  Plan: - Proceed with cataract surgery OS when ready - Plan for best distance target with DIB00 - No DM, no fuchs, no prior eye surgery - good dilation - Dextenza  if available

## 2024-01-17 DIAGNOSIS — H2512 Age-related nuclear cataract, left eye: Secondary | ICD-10-CM | POA: Diagnosis not present

## 2024-01-18 ENCOUNTER — Encounter (HOSPITAL_COMMUNITY)
Admission: RE | Admit: 2024-01-18 | Discharge: 2024-01-18 | Disposition: A | Discharge status: Home / Self Care | Source: Ambulatory Visit | Attending: Optometry | Admitting: Optometry

## 2024-01-23 ENCOUNTER — Encounter (HOSPITAL_COMMUNITY)
Admission: RE | Disposition: A | Discharge status: Home / Self Care | Payer: Self-pay | Source: Home / Self Care | Attending: Optometry

## 2024-01-23 ENCOUNTER — Ambulatory Visit (HOSPITAL_COMMUNITY)
Admission: RE | Admit: 2024-01-23 | Discharge: 2024-01-23 | Disposition: A | Discharge status: Home / Self Care | Attending: Optometry | Admitting: Optometry

## 2024-01-23 ENCOUNTER — Ambulatory Visit (HOSPITAL_COMMUNITY): Admitting: Anesthesiology

## 2024-01-23 ENCOUNTER — Encounter (HOSPITAL_COMMUNITY): Payer: Self-pay | Admitting: Optometry

## 2024-01-23 ENCOUNTER — Other Ambulatory Visit: Payer: Self-pay

## 2024-01-23 DIAGNOSIS — N289 Disorder of kidney and ureter, unspecified: Secondary | ICD-10-CM | POA: Diagnosis not present

## 2024-01-23 DIAGNOSIS — H2512 Age-related nuclear cataract, left eye: Secondary | ICD-10-CM

## 2024-01-23 DIAGNOSIS — I10 Essential (primary) hypertension: Secondary | ICD-10-CM | POA: Diagnosis not present

## 2024-01-23 DIAGNOSIS — Z961 Presence of intraocular lens: Secondary | ICD-10-CM | POA: Diagnosis not present

## 2024-01-23 DIAGNOSIS — Z9841 Cataract extraction status, right eye: Secondary | ICD-10-CM | POA: Diagnosis not present

## 2024-01-23 HISTORY — PX: CATARACT EXTRACTION W/PHACO: SHX586

## 2024-01-23 SURGERY — PHACOEMULSIFICATION, CATARACT, WITH IOL INSERTION
Anesthesia: Monitor Anesthesia Care | Site: Eye | Laterality: Left

## 2024-01-23 MED ORDER — SODIUM CHLORIDE 0.9% FLUSH: 3.0000 mL | INTRAVENOUS | Status: DC | PRN

## 2024-01-23 MED ORDER — LIDOCAINE HCL 3.5 % OP GEL
1.0000 | Freq: Once | OPHTHALMIC | Status: AC
  Administered 2024-01-23: 1 via OPHTHALMIC

## 2024-01-23 MED ORDER — POVIDONE-IODINE 5 % OP SOLN
OPHTHALMIC | Status: DC | PRN
  Administered 2024-01-23: 1 via OPHTHALMIC

## 2024-01-23 MED ORDER — PHENYLEPHRINE-KETOROLAC 1-0.3 % IO SOLN
INTRAOCULAR | Status: DC | PRN
  Administered 2024-01-23: 500 mL via OPHTHALMIC

## 2024-01-23 MED ORDER — DEXAMETHASONE 0.4 MG OP INST
VAGINAL_INSERT | OPHTHALMIC | Status: AC
  Filled 2024-01-23: qty 1

## 2024-01-23 MED ORDER — PHENYLEPHRINE HCL 2.5 % OP SOLN
1.0000 [drp] | OPHTHALMIC | Status: AC | PRN
  Administered 2024-01-23 (×3): 1 [drp] via OPHTHALMIC

## 2024-01-23 MED ORDER — SIGHTPATH DOSE#1 NA HYALUR & NA CHOND-NA HYALUR IO KIT
PACK | INTRAOCULAR | Status: DC | PRN
  Administered 2024-01-23: 1 via OPHTHALMIC

## 2024-01-23 MED ORDER — SODIUM CHLORIDE 0.9% FLUSH: 3.0000 mL | Freq: Two times a day (BID) | INTRAVENOUS | Status: DC

## 2024-01-23 MED ORDER — LACTATED RINGERS IV SOLN: INTRAVENOUS | Status: DC

## 2024-01-23 MED ORDER — LIDOCAINE HCL (PF) 1 % IJ SOLN
INTRAMUSCULAR | Status: DC | PRN
  Administered 2024-01-23: 1 mL

## 2024-01-23 MED ORDER — MIDAZOLAM HCL 2 MG/2ML IJ SOLN
INTRAMUSCULAR | Status: AC
  Filled 2024-01-23: qty 2

## 2024-01-23 MED ORDER — SODIUM CHLORIDE 0.9% FLUSH
INTRAVENOUS | Status: DC | PRN
  Administered 2024-01-23: 5 mL via INTRAVENOUS

## 2024-01-23 MED ORDER — BSS IO SOLN
INTRAOCULAR | Status: DC | PRN
  Administered 2024-01-23: 15 mL via INTRAOCULAR

## 2024-01-23 MED ORDER — TROPICAMIDE 1 % OP SOLN
1.0000 [drp] | OPHTHALMIC | Status: AC | PRN
  Administered 2024-01-23 (×3): 1 [drp] via OPHTHALMIC

## 2024-01-23 MED ORDER — TETRACAINE HCL 0.5 % OP SOLN
1.0000 [drp] | OPHTHALMIC | Status: AC | PRN
  Administered 2024-01-23 (×3): 1 [drp] via OPHTHALMIC

## 2024-01-23 MED ORDER — STERILE WATER FOR IRRIGATION IR SOLN
Status: DC | PRN
  Administered 2024-01-23: 250 mL

## 2024-01-23 MED ORDER — DEXAMETHASONE 0.4 MG OP INST: VAGINAL_INSERT | OPHTHALMIC | Status: DC | PRN

## 2024-01-23 MED ORDER — MIDAZOLAM HCL 2 MG/2ML IJ SOLN
INTRAMUSCULAR | Status: DC | PRN
  Administered 2024-01-23: 1 mg via INTRAVENOUS

## 2024-01-23 MED ORDER — MOXIFLOXACIN HCL 5 MG/ML IO SOLN
INTRAOCULAR | Status: DC | PRN
  Administered 2024-01-23: .2 mL via INTRACAMERAL

## 2024-01-23 SURGICAL SUPPLY — 14 items
CATARACT SUITE SIGHTPATH (MISCELLANEOUS) ×2 IMPLANT
CLOTH BEACON ORANGE TIMEOUT ST (SAFETY) ×2 IMPLANT
DRSG TEGADERM 4X4.75 (GAUZE/BANDAGES/DRESSINGS) ×2 IMPLANT
EYE SHIELD UNIVERSAL CLEAR (GAUZE/BANDAGES/DRESSINGS) ×1 IMPLANT
FEE CATARACT SUITE SIGHTPATH (MISCELLANEOUS) ×1 IMPLANT
GLOVE BIOGEL PI IND STRL 7.0 (GLOVE) ×4 IMPLANT
LENS IOL TECNIS EYHANCE 20.5 (Intraocular Lens) ×1 IMPLANT
NDL HYPO 18GX1.5 BLUNT FILL (NEEDLE) ×1 IMPLANT
NEEDLE HYPO 18GX1.5 BLUNT FILL (NEEDLE) ×2 IMPLANT
PAD ARMBOARD POSITIONER FOAM (MISCELLANEOUS) ×2 IMPLANT
POSITIONER HEAD 8X9X4 ADT (SOFTGOODS) ×2 IMPLANT
SYR TB 1ML LL NO SAFETY (SYRINGE) ×2 IMPLANT
TAPE SURG TRANSPORE 1 IN (GAUZE/BANDAGES/DRESSINGS) ×1 IMPLANT
WATER STERILE IRR 250ML POUR (IV SOLUTION) ×2 IMPLANT

## 2024-01-23 NOTE — Anesthesia Postprocedure Evaluation (Signed)
 Anesthesia Post Note  Patient: Dennis Gilbert  Procedure(s) Performed: PHACOEMULSIFICATION, CATARACT, WITH IOL INSERTION (Left: Eye)  Patient location during evaluation: Phase II Anesthesia Type: MAC Level of consciousness: awake and alert Pain management: pain level controlled Vital Signs Assessment: post-procedure vital signs reviewed and stable Respiratory status: spontaneous breathing, nonlabored ventilation and respiratory function stable Cardiovascular status: stable and blood pressure returned to baseline Postop Assessment: no apparent nausea or vomiting Anesthetic complications: no   There were no known notable events for this encounter.   Last Vitals:  Vitals:   01/23/24 0816 01/23/24 0925  BP: (!) 187/63 (!) 162/83  Pulse: 82 (!) 44  Resp: 16 16  Temp: 36.9 C   SpO2: 99% 100%    Last Pain:  Vitals:   01/23/24 0816  TempSrc: Oral  PainSc: 0-No pain                 Doris Mcgilvery L Haizley Cannella

## 2024-01-23 NOTE — Op Note (Signed)
 Date of procedure: 01/23/24  Pre-operative diagnosis: Visually significant age-related nuclear cataract, Left Eye (H25.12)  Post-operative diagnosis: Visually significant age-related nuclear cataract, Left Eye H25.12  Procedure: Removal of cataract via phacoemulsification and insertion of intra-ocular lens J&J DIB00 +20.5D into the capsular bag of the Left Eye  Attending surgeon: Marsa JINNY Cleverly, MD  Anesthesia: MAC, Topical Akten   Complications: None  Estimated Blood Loss: <80mL (minimal)  Specimens: None  Implants:  Implant Name Type Inv. Item Serial No. Manufacturer Lot No. LRB No. Used Action  LENS IOL TECNIS EYHANCE 20.5 - [id number] Intraocular Lens LENS IOL TECNIS EYHANCE 20.5 [id number] SIGHTPATH  Left 1 Implanted    Indications:  Visually significant age-related cataract, Left Eye  Procedure:  The patient was seen and identified in the pre-operative area. The operative eye was identified and dilated.  The operative eye was marked.  Topical anesthesia was administered to the operative eye.     The patient was then to the operative suite and placed in the supine position.  A timeout was performed confirming the patient, procedure to be performed, and all other relevant information.   The patient's face was prepped and draped in the usual fashion for intra-ocular surgery.  A lid speculum was placed into the operative eye and the surgical microscope moved into place and focused.  An inferotemporal paracentesis was created using a 20 gauge paracentesis blade.  BSS mixed with Omidria , followed by 1% lidocaine  was injected into the anterior chamber.  Viscoelastic was injected into the anterior chamber.  A temporal clear-corneal main wound incision was created using a 2.73mm microkeratome.  A continuous curvilinear capsulorrhexis was initiated using an irrigating cystitome and completed using capsulorrhexis forceps.  Hydrodissection and hydrodeliniation were performed.  Viscoelastic  was injected into the anterior chamber.  A phacoemulsification handpiece and a chopper as a second instrument were used to remove the nucleus and epinucleus. The irrigation/aspiration handpiece was used to remove any remaining cortical material.   The capsular bag was reinflated with viscoelastic, checked, and found to be intact.  The intraocular lens was inserted into the capsular bag.  The irrigation/aspiration handpiece was used to remove any remaining viscoelastic.  The clear corneal wound and paracentesis wounds were then hydrated and checked with Weck-Cels to be watertight. Moxifloxacin  was instilled into the anterior chamber.  The lid-speculum and drape were removed. The lower punctum was assessed but could not be dilated, and no dextenza  was placed into it. The patient's face was cleaned with a wet and dry 4x4.  A clear shield was taped over the eye. The patient was taken to the post-operative care unit in good condition, having tolerated the procedure well.  Post-Op Instructions: The patient will follow up at [facility] for a same day post-operative evaluation and will receive all other orders and instructions.

## 2024-01-23 NOTE — Anesthesia Preprocedure Evaluation (Signed)
 Anesthesia Evaluation  Patient identified by MRN, date of birth, ID band Patient awake    Reviewed: Allergy & Precautions, H&P , NPO status , Patient's Chart, lab work & pertinent test results, reviewed documented beta blocker date and time   Airway Mallampati: II  TM Distance: >3 FB Neck ROM: full    Dental no notable dental hx.    Pulmonary neg pulmonary ROS   Pulmonary exam normal breath sounds clear to auscultation       Cardiovascular Exercise Tolerance: Good hypertension, Normal cardiovascular exam Rhythm:regular Rate:Normal     Neuro/Psych negative neurological ROS  negative psych ROS   GI/Hepatic negative GI ROS, Neg liver ROS,,,  Endo/Other  negative endocrine ROS    Renal/GU Renal InsufficiencyRenal disease  negative genitourinary   Musculoskeletal   Abdominal   Peds  Hematology negative hematology ROS (+)   Anesthesia Other Findings   Reproductive/Obstetrics negative OB ROS                              Anesthesia Physical Anesthesia Plan  ASA: 2  Anesthesia Plan: MAC   Post-op Pain Management: Minimal or no pain anticipated   Induction:   PONV Risk Score and Plan:   Airway Management Planned: Nasal Cannula and Natural Airway  Additional Equipment: None  Intra-op Plan:   Post-operative Plan:   Informed Consent: I have reviewed the patients History and Physical, chart, labs and discussed the procedure including the risks, benefits and alternatives for the proposed anesthesia with the patient or authorized representative who has indicated his/her understanding and acceptance.     Dental Advisory Given  Plan Discussed with: CRNA  Anesthesia Plan Comments:         Anesthesia Quick Evaluation

## 2024-01-23 NOTE — Transfer of Care (Signed)
 Immediate Anesthesia Transfer of Care Note  Patient: Dennis Gilbert  Procedure(s) Performed: PHACOEMULSIFICATION, CATARACT, WITH IOL INSERTION (Left: Eye)  Patient Location: Short Stay  Anesthesia Type:MAC  Level of Consciousness: awake, alert , and oriented  Airway & Oxygen Therapy: Patient Spontanous Breathing  Post-op Assessment: Report given to RN and Post -op Vital signs reviewed and stable  Post vital signs: Reviewed and stable  Last Vitals:  Vitals Value Taken Time  BP 162/83   Temp    Pulse 54   Resp 24   SpO2 100%     Last Pain:  Vitals:   01/23/24 0816  TempSrc: Oral  PainSc: 0-No pain         Complications: No notable events documented.

## 2024-01-23 NOTE — Discharge Instructions (Signed)
 Please discharge patient when stable, will follow up today with Dr. Ilsa Iha at the [facility] office immediately following discharge.  Leave shield in place until visit.  All paperwork with discharge instructions will be given at the office.  [facility] Address:  [address]  [place], [place] [address]  Dr. Chaya Jan Phone: [phone]

## 2024-01-23 NOTE — Interval H&P Note (Signed)
 History and Physical Interval Note:  01/23/2024 8:30 AM  The H and P was reviewed and updated. The patient was examined.  No changes were found after exam.  The surgical eye was marked.    Lanson Randle

## 2024-01-24 ENCOUNTER — Encounter (HOSPITAL_COMMUNITY): Payer: Self-pay | Admitting: Optometry

## 2024-02-16 DIAGNOSIS — E782 Mixed hyperlipidemia: Secondary | ICD-10-CM | POA: Diagnosis not present

## 2024-02-16 DIAGNOSIS — R7303 Prediabetes: Secondary | ICD-10-CM | POA: Diagnosis not present

## 2024-02-16 DIAGNOSIS — D509 Iron deficiency anemia, unspecified: Secondary | ICD-10-CM | POA: Diagnosis not present

## 2024-02-21 DIAGNOSIS — Z8719 Personal history of other diseases of the digestive system: Secondary | ICD-10-CM | POA: Diagnosis not present

## 2024-02-21 DIAGNOSIS — G4700 Insomnia, unspecified: Secondary | ICD-10-CM | POA: Diagnosis not present

## 2024-02-21 DIAGNOSIS — E782 Mixed hyperlipidemia: Secondary | ICD-10-CM | POA: Diagnosis not present

## 2024-02-21 DIAGNOSIS — Z860100 Personal history of colon polyps, unspecified: Secondary | ICD-10-CM | POA: Diagnosis not present

## 2024-02-21 DIAGNOSIS — D509 Iron deficiency anemia, unspecified: Secondary | ICD-10-CM | POA: Diagnosis not present

## 2024-02-21 DIAGNOSIS — I10 Essential (primary) hypertension: Secondary | ICD-10-CM | POA: Diagnosis not present

## 2024-02-21 DIAGNOSIS — I129 Hypertensive chronic kidney disease with stage 1 through stage 4 chronic kidney disease, or unspecified chronic kidney disease: Secondary | ICD-10-CM | POA: Diagnosis not present

## 2024-02-21 DIAGNOSIS — R7303 Prediabetes: Secondary | ICD-10-CM | POA: Diagnosis not present

## 2024-02-21 DIAGNOSIS — D696 Thrombocytopenia, unspecified: Secondary | ICD-10-CM | POA: Diagnosis not present

## 2024-02-21 DIAGNOSIS — E875 Hyperkalemia: Secondary | ICD-10-CM | POA: Diagnosis not present

## 2024-02-21 DIAGNOSIS — M545 Low back pain, unspecified: Secondary | ICD-10-CM | POA: Diagnosis not present

## 2024-02-21 DIAGNOSIS — N1831 Chronic kidney disease, stage 3a: Secondary | ICD-10-CM | POA: Diagnosis not present

## 2024-03-20 DIAGNOSIS — Z23 Encounter for immunization: Secondary | ICD-10-CM | POA: Diagnosis not present
